# Patient Record
Sex: Male | Born: 1935 | Race: White | Hispanic: No | Smoking: Former smoker
Health system: Southern US, Community
[De-identification: ages and names within clinical notes are randomized; demographics above are authoritative.]

## PROBLEM LIST (undated history)

## (undated) DIAGNOSIS — R7302 Impaired glucose tolerance (oral): Secondary | ICD-10-CM

## (undated) DIAGNOSIS — K209 Esophagitis, unspecified without bleeding: Secondary | ICD-10-CM

## (undated) DIAGNOSIS — K5900 Constipation, unspecified: Secondary | ICD-10-CM

## (undated) DIAGNOSIS — Z974 Presence of external hearing-aid: Secondary | ICD-10-CM

## (undated) DIAGNOSIS — E119 Type 2 diabetes mellitus without complications: Secondary | ICD-10-CM

## (undated) DIAGNOSIS — H9319 Tinnitus, unspecified ear: Secondary | ICD-10-CM

## (undated) DIAGNOSIS — H9190 Unspecified hearing loss, unspecified ear: Secondary | ICD-10-CM

## (undated) DIAGNOSIS — R350 Frequency of micturition: Secondary | ICD-10-CM

## (undated) HISTORY — PX: OTHER SURGICAL HISTORY: SHX169

## (undated) HISTORY — DX: Tinnitus, unspecified ear: H93.19

## (undated) HISTORY — DX: Constipation, unspecified: K59.00

## (undated) HISTORY — DX: Esophagitis, unspecified without bleeding: K20.90

## (undated) HISTORY — DX: Frequency of micturition: R35.0

## (undated) HISTORY — DX: Esophagitis, unspecified: K20.9

## (undated) HISTORY — DX: Impaired glucose tolerance (oral): R73.02

---

## 2005-07-14 ENCOUNTER — Other Ambulatory Visit: Payer: Self-pay

## 2005-07-14 ENCOUNTER — Ambulatory Visit: Payer: Self-pay | Admitting: General Practice

## 2005-07-21 ENCOUNTER — Ambulatory Visit: Payer: Self-pay | Admitting: General Practice

## 2005-09-10 ENCOUNTER — Encounter: Payer: Self-pay | Admitting: General Practice

## 2005-09-16 ENCOUNTER — Encounter: Payer: Self-pay | Admitting: General Practice

## 2005-10-17 ENCOUNTER — Encounter: Payer: Self-pay | Admitting: General Practice

## 2006-02-10 ENCOUNTER — Ambulatory Visit: Payer: Self-pay | Admitting: Gastroenterology

## 2006-12-19 ENCOUNTER — Ambulatory Visit: Payer: Self-pay | Admitting: Gastroenterology

## 2007-09-27 ENCOUNTER — Ambulatory Visit: Payer: Self-pay | Admitting: Internal Medicine

## 2008-05-17 ENCOUNTER — Ambulatory Visit: Payer: Self-pay | Admitting: General Practice

## 2011-01-01 ENCOUNTER — Ambulatory Visit: Payer: Self-pay | Admitting: Surgery

## 2011-01-08 ENCOUNTER — Ambulatory Visit: Payer: Self-pay | Admitting: Surgery

## 2013-03-28 ENCOUNTER — Ambulatory Visit: Payer: Self-pay | Admitting: Otolaryngology

## 2014-05-15 DIAGNOSIS — K209 Esophagitis, unspecified without bleeding: Secondary | ICD-10-CM | POA: Insufficient documentation

## 2014-05-15 DIAGNOSIS — R7303 Prediabetes: Secondary | ICD-10-CM | POA: Insufficient documentation

## 2014-05-15 DIAGNOSIS — H9319 Tinnitus, unspecified ear: Secondary | ICD-10-CM | POA: Insufficient documentation

## 2014-05-15 DIAGNOSIS — K5900 Constipation, unspecified: Secondary | ICD-10-CM | POA: Insufficient documentation

## 2014-09-20 DIAGNOSIS — K644 Residual hemorrhoidal skin tags: Secondary | ICD-10-CM | POA: Insufficient documentation

## 2014-09-20 DIAGNOSIS — R739 Hyperglycemia, unspecified: Secondary | ICD-10-CM | POA: Insufficient documentation

## 2014-11-14 ENCOUNTER — Ambulatory Visit
Admit: 2014-11-14 | Disposition: A | Discharge status: Home / Self Care | Payer: Self-pay | Attending: Neurology | Admitting: Neurology

## 2014-12-18 ENCOUNTER — Encounter: Payer: Self-pay | Admitting: Urology

## 2014-12-18 ENCOUNTER — Other Ambulatory Visit: Payer: PPO

## 2014-12-23 ENCOUNTER — Telehealth: Payer: Self-pay

## 2014-12-23 ENCOUNTER — Other Ambulatory Visit: Payer: Self-pay

## 2014-12-23 DIAGNOSIS — N3289 Other specified disorders of bladder: Secondary | ICD-10-CM

## 2014-12-23 NOTE — Telephone Encounter (Signed)
Daughter called wanting lab results. Made aware of testosterone results. Daughter questioned insurance coverage. Made daughter aware front office would need to get back in contact with her in reference to insurance. Voiced understanding. Cw,lpn

## 2014-12-25 ENCOUNTER — Encounter: Payer: Self-pay | Admitting: *Deleted

## 2014-12-26 ENCOUNTER — Ambulatory Visit (INDEPENDENT_AMBULATORY_CARE_PROVIDER_SITE_OTHER): Payer: PPO | Admitting: Urology

## 2014-12-26 ENCOUNTER — Encounter: Payer: Self-pay | Admitting: Urology

## 2014-12-26 VITALS — BP 174/83 | HR 64 | Ht 70.0 in | Wt 166.3 lb

## 2014-12-26 DIAGNOSIS — K8020 Calculus of gallbladder without cholecystitis without obstruction: Secondary | ICD-10-CM | POA: Diagnosis not present

## 2014-12-26 DIAGNOSIS — N138 Other obstructive and reflux uropathy: Secondary | ICD-10-CM

## 2014-12-26 DIAGNOSIS — E291 Testicular hypofunction: Secondary | ICD-10-CM | POA: Diagnosis not present

## 2014-12-26 DIAGNOSIS — N401 Enlarged prostate with lower urinary tract symptoms: Secondary | ICD-10-CM

## 2014-12-26 MED ORDER — TESTOSTERONE 20.25 MG/1.25GM (1.62%) TD GEL: 20.0000 | Freq: Every day | TRANSDERMAL | Status: DC

## 2014-12-26 NOTE — Progress Notes (Signed)
12/26/2014 6:33 PM   CURLEY HOGEN May 14, 1934 [id number]  Referring provider: No referring provider defined for this encounter.  Chief Complaint: low testosterone level  HPI: Mr. Bordas is a 81 year old white male who presents today to discuss treatment options for his hypogonadism.  Patient was referred to [place] by his neurologist for urinary frequency. He is accompanied by his grandson at today's visit. His daughter was also on the phone, she could not be physically present for this visit. All 3 of them are very upset.  They feel like they've been getting the run around by his providers. The patient states he is just "shelling out money" and nobody will help him. During his visit with Dr. Edwyna Shell, he complained of fatigue. A serum testosterone level was drawn that afternoon and it returned low. He returned for a morning serum testosterone draw on 12/18/2014 and it was found to be 258 ng/dL. When  I questioned him further about his fatigue,  he stated over the last month had been experiencing progressive weakness in his lower legs. I stated that this sounded more like a neurological condition versus low testosterone. I asked him if he had any imaging done on his lower spine. He informed me that his neurologist ordered an MRI on 11/14/2014.  I was able to see the report on "chart review"  and there were findings of  mild left eccentric foraminal impingement at L5-S1 due to spurring along the solid intervertebral fusion. There is degenerative disc disease at other levels in the lumbar spine, but no other impingement is identified.  Suspected cholelithiasis.  I asked him if he had a follow-up appointment with his neurologist to the discussed these results and he stated he canceled that visit.  He did fill out an ADAM questionnaire and results are below.  He is complaining of other symptoms of hypogonadism, such as: a decrease in libido, lack of energy, a decrease in his strength and endurance, directions  being less strong, deterioration in his ability to play sports and a recent deterioration in his work.    The patient had told Dr. Edwyna Shell during their visit they found a spot on his bladder with the MRI and Dr. Edwyna Shell has ordered a CT urogram.  The patient denies any gross hematuria and no micro heme with his visit with Dr. Edwyna Shell.  It appears that the "spot on the bladder" was gallstones.   He was started on Vesicare 10 mg daily for his urinary frequency. He found no decrease in his urinary frequency with that medication, so he has discontinued the Vesicare.  He also had extreme dry mouth with the Vesicare.   Electronically Signed  By: Gaylyn Rong M.D.  On: 11/14/2014 12:13      Androgen Deficiency in the Aging Male      12/28/14 1700       Androgen Deficiency in the Aging Male   Do you have a decrease in libido (sex drive) Yes     Do you have lack of energy Yes     Do you have a decrease in strength and/or endurance Yes     Have you lost height No     Have you noticed a decreased "enjoyment of life" No     Are you sad and/or grumpy No     Are your erections less strong Yes     Have you noticed a recent deterioration in your ability to play sports Yes     Are you falling asleep  after dinner No     Has there been a recent deterioration in your work performance Yes          PMH: Past Medical History  Diagnosis Date  . Impaired glucose tolerance   . Esophagitis   . Tinnitus   . Constipation   . Urinary frequency   . Impaired glucose tolerance   . Esophagitis   . Tinnitus   . Constipation   . Urinary frequency     Surgical History: Past Surgical History  Procedure Laterality Date  . Palmer fascietomy and release of dupuytren's contracture      right little finger, left ring and little fingers    Home Medications:    Medication List       This list is accurate as of: 12/26/14 11:59 PM.  Always use your most recent med list.               acetaminophen  325 MG tablet  Commonly known as:  TYLENOL  Take 650 mg by mouth every 6 (six) hours as needed.     Cinnamon 500 MG capsule  Take 500 mg by mouth.     ibuprofen 100 MG tablet  Commonly known as:  ADVIL,MOTRIN  Take 100 mg by mouth every 6 (six) hours as needed for fever.     LORazepam 1 MG tablet  Commonly known as:  ATIVAN  take 1 pill 60 min before MRI and 2nd pill 15 min before MRI if needed. Patient should not drive when under influence of medicine.     omeprazole 20 MG capsule  Commonly known as:  PRILOSEC  Take 20 mg by mouth.     STIMULANT LAXATIVE 5 MG EC tablet  Generic drug:  bisacodyl  Take 5 mg by mouth.     Testosterone 20.25 MG/1.25GM (1.62%) Gel  Commonly known as:  ANDROGEL  Apply 20 each topically daily. Patient needs to apply 2 pumps daily     V-R VITAMIN B-12 500 MCG tablet  Generic drug:  cyanocobalamin  Take by mouth.        Allergies: No Known Allergies  Family History: Family History  Problem Relation Age of Onset  . Cancer    . Diabetes Mellitus II Mother   . Heart attack Father     Social History:  reports that he has quit smoking. He does not have any smokeless tobacco history on file. He reports that he does not drink alcohol. His drug history is not on file.  ROS: Urological Symptom Review  Patient is experiencing the following symptoms: Frequent urination Get up at night to urinate   Review of Systems  Gastrointestinal (upper)  : Indigestion/heartburn  Gastrointestinal (lower) : Constipation  Constitutional : Fatigue  Skin: Skin rash/lesion  Eyes: Negative for eye symptoms  Ear/Nose/Throat : Negative for Ear/Nose/Throat symptoms  Hematologic/Lymphatic: Easy bruising  Cardiovascular : Negative for cardiovascular symptoms  Respiratory : Negative for respiratory symptoms  Endocrine: Negative for endocrine symptoms  Musculoskeletal: Negative for musculoskeletal  symptoms  Neurological: Headaches  Psychologic: Negative for psychiatric symptoms   Physical Exam: BP 174/83 mmHg  Pulse 64  Ht 5\' 10"  (1.778 m)  Wt 166 lb 4.8 oz (75.433 kg)  BMI 23.86 kg/m2   Laboratory Data: No results found for: WBC, HGB, HCT, MCV, PLT  No results found for: CREATININE  No results found for: PSA  Lab Results  Component Value Date   TESTOSTERONE 258* 12/18/2014    No results found for:  HGBA1C  Urinalysis No results found for: COLORURINE, APPEARANCEUR, LABSPEC, PHURINE, GLUCOSEU, HGBUR, BILIRUBINUR, KETONESUR, PROTEINUR, UROBILINOGEN, NITRITE, LEUKOCYTESUR  Pertinent Imaging: CLINICAL DATA: Lower extremity weakness bilaterally  EXAM: MRI LUMBAR SPINE WITHOUT CONTRAST  TECHNIQUE: Multiplanar, multisequence MR imaging of the lumbar spine was performed. No intravenous contrast was administered.  COMPARISON: None.  FINDINGS: Dependent filling defects in a fluid-filled structure in the right upper quadrant suspicious for cholelithiasis.  The conus medullaris appears normal. Conus level: L1-2.  Solid interbody fusion at L5-S1. Disc desiccation is noted throughout the lumbar spine. Minimal degenerative endplate findings anteriorly at L1-2.  Additional findings at individual levels are as follows:  L1-2: No impingement. Mild disc bulge.  L2-3: No impingement. Incidental prominent bridging interbody spurring eccentric to the left.  L3-4: No impingement. Mild disc bulge.  L4-5: No impingement. Mild disc bulge.  L5-S1: Mild left and borderline right foraminal stenosis primarily due spurring along the intervertebral fusion.  IMPRESSION: 1. Mild left eccentric foraminal impingement at L5-S1 due to spurring along the solid intervertebral fusion. There is degenerative disc disease at other levels in the lumbar spine, but no other impingement is identified. 2. Suspected cholelithiasis.   Electronically Signed   By: Gaylyn Rong M.D.  On: 11/14/2014 12:13  Assessment & Plan:   1. Hypogonadism-  I explained to the patient that his lower leg weakness most likely contributed to by the findings on MRI. He needs to follow-up with his neurologist. He did have a follow-up appointment with his neurologist, but canceled that appointment. I encouraged him to reschedule.  I stated that even with testosterone therapy the lower leg weakness may still persist.  The patient and his family are not satisfied with this answer and since there is a low testosterone value and he has other symptoms of hypogonadism, I feel I need to treat the hypogonadism.  I discussed with the patient the side effects of testosterone therapy, such as: enlargement of the prostate gland that may in turn cause LUTS, possible increased risk of PCa, DVT's and/or PE's, possible increased risk of heart attack or stroke, lower sperm count, swelling of the ankles, feet, or body, with or without heart failure, enlarged or painful breasts, have problems breathing while you sleep (sleep apnea), increased prostate specific antigen, mood swings, hypertension and  increased red blood cell count.  After this discussion, He and his family are still wanting to proceed with the testosterone treatment.  I will prescribe AndroGel and advised him to apply 2 pumps daily and avoid to skin contact with women and males below the age of 72. He will return to the office in 1 month's time where we will repeat the ADAM questionnaire, an a.m. serum testosterone level (before 10 AM) and a symptom recheck.  I have also obtained a prolactin, LH and FSH at today's visit.  2. "Spot on bladder"-  by patient's report, he stated a spot was found on his bladder during the MRI. When reviewing the MRI report, the area of concern was located in the gallbladder and likely represents gallstones. Patient does not endorse gross hematuria nor did he have microscopic hematuria at his last visit.  We will cancel the CT urogram and the cystoscopy at this time. We will recheck an UA when he returns for an office visit in one month.  3. BPH with LUTS-  Patient was found to have an enlarged prostate on exam by Dr. Edwyna Shell. His PSA was 0.6 ng/mL on 12/13/2014. His symptoms did not respond to  the Vesicare.  He was scheduled for a cystoscopy because of the patient's report of a spot on his bladder. On further investigation, the spot was actually on the gallbladder and likely represented gallstones. The cystoscopy exam will be postponed. He will RTC in 1 month's time.  At that visit, we will obtain an IPSS and PVR.    There are no diagnoses linked to this encounter.  No Follow-up on file.  Michiel Cowboy, PA-C  [facility] [address], [address] [place], [place] [address] [phone]

## 2014-12-27 LAB — FSH/LH
FSH: 16 m[IU]/mL — ABNORMAL HIGH (ref 1.5–12.4)
LH: 21.2 m[IU]/mL — ABNORMAL HIGH (ref 1.7–8.6)

## 2014-12-27 LAB — PROLACTIN: Prolactin: 10.6 ng/mL (ref 4.0–15.2)

## 2014-12-27 LAB — TESTOSTERONE: TESTOSTERONE: 258 ng/dL — AB (ref 348–1197)

## 2014-12-28 ENCOUNTER — Telehealth: Payer: Self-pay | Admitting: Urology

## 2014-12-28 DIAGNOSIS — N138 Other obstructive and reflux uropathy: Secondary | ICD-10-CM | POA: Insufficient documentation

## 2014-12-28 DIAGNOSIS — E291 Testicular hypofunction: Secondary | ICD-10-CM | POA: Insufficient documentation

## 2014-12-28 DIAGNOSIS — N401 Enlarged prostate with lower urinary tract symptoms: Secondary | ICD-10-CM

## 2014-12-28 DIAGNOSIS — K8020 Calculus of gallbladder without cholecystitis without obstruction: Secondary | ICD-10-CM | POA: Insufficient documentation

## 2014-12-28 NOTE — Telephone Encounter (Signed)
We also need to cancel his cystoscopy and have him follow up with me in one month.

## 2014-12-28 NOTE — Telephone Encounter (Signed)
We need to cancel his CT Urogram.  Patient does not have hematuria and the "spot" on the bladder was in the gallbladder, not the urinary bladder.

## 2014-12-30 ENCOUNTER — Telehealth: Payer: Self-pay

## 2014-12-30 NOTE — Telephone Encounter (Signed)
-----   Message from Harle Battiest, PA-C sent at 12/29/2014  7:29 PM EDT ----- Patient's LH and FSH are elevated. He needs a referral to an endocrinologist.

## 2014-12-30 NOTE — Telephone Encounter (Signed)
Pt called back about the missed call this morning. Troy Preston would like to make it clear that [name] is to receive all phone calls. She is in charge of everything for Troy Preston. Troy Preston best contact # [phone].  12/30/14 MAF

## 2014-12-30 NOTE — Telephone Encounter (Signed)
Pt has been made aware of elevated labs. Can you please schedule referral. Cw,lpn

## 2014-12-31 ENCOUNTER — Telehealth: Payer: Self-pay

## 2014-12-31 NOTE — Telephone Encounter (Signed)
Spoke with Durene Fruits, pt daughter, in reference to Androgel PA. Androgel was approved until 07/19/15. Cw,lpn

## 2015-01-15 ENCOUNTER — Ambulatory Visit: Payer: Self-pay

## 2015-01-24 ENCOUNTER — Other Ambulatory Visit: Payer: PPO

## 2015-01-24 DIAGNOSIS — E291 Testicular hypofunction: Secondary | ICD-10-CM

## 2015-01-25 LAB — TESTOSTERONE: Testosterone: 332 ng/dL — ABNORMAL LOW (ref 348–1197)

## 2015-01-25 LAB — FSH/LH
FSH: 9.7 m[IU]/mL (ref 1.5–12.4)
LH: 10.6 m[IU]/mL — AB (ref 1.7–8.6)

## 2015-01-27 ENCOUNTER — Telehealth: Payer: Self-pay

## 2015-01-27 NOTE — Telephone Encounter (Signed)
-----   Message from Vanna ScotlandAshley Brandon, MD sent at 01/27/2015  2:54 PM EDT ----- Please let patient's daughter know results.  I don't know what Carollee HerterShannon will want to do with this information but it seems that the LH/FSH have normalized somewhat and that his Isidor Holtsesterone is very close to normal.  Further recs can be make by Carollee HerterShannon when she returns next week.  Vanna ScotlandAshley Brandon, MD

## 2015-01-29 ENCOUNTER — Other Ambulatory Visit: Payer: Self-pay

## 2015-01-29 ENCOUNTER — Encounter: Payer: Self-pay | Admitting: *Deleted

## 2015-01-29 ENCOUNTER — Emergency Department
Admission: EM | Admit: 2015-01-29 | Discharge: 2015-01-29 | Disposition: A | Discharge status: Home / Self Care | Payer: PPO | Attending: Emergency Medicine | Admitting: Emergency Medicine

## 2015-01-29 DIAGNOSIS — R61 Generalized hyperhidrosis: Secondary | ICD-10-CM | POA: Insufficient documentation

## 2015-01-29 DIAGNOSIS — R112 Nausea with vomiting, unspecified: Secondary | ICD-10-CM | POA: Insufficient documentation

## 2015-01-29 DIAGNOSIS — R55 Syncope and collapse: Secondary | ICD-10-CM

## 2015-01-29 DIAGNOSIS — Z87891 Personal history of nicotine dependence: Secondary | ICD-10-CM | POA: Diagnosis not present

## 2015-01-29 DIAGNOSIS — Z79899 Other long term (current) drug therapy: Secondary | ICD-10-CM | POA: Insufficient documentation

## 2015-01-29 DIAGNOSIS — R42 Dizziness and giddiness: Secondary | ICD-10-CM | POA: Diagnosis present

## 2015-01-29 LAB — CBC WITH DIFFERENTIAL/PLATELET
BASOS ABS: 0 10*3/uL (ref 0–0.1)
Basophils Relative: 1 %
EOS ABS: 0.1 10*3/uL (ref 0–0.7)
Eosinophils Relative: 2 %
HCT: 40.3 % (ref 40.0–52.0)
Hemoglobin: 13.8 g/dL (ref 13.0–18.0)
LYMPHS PCT: 24 %
Lymphs Abs: 1.4 10*3/uL (ref 1.0–3.6)
MCH: 31.9 pg (ref 26.0–34.0)
MCHC: 34.3 g/dL (ref 32.0–36.0)
MCV: 93 fL (ref 80.0–100.0)
MONOS PCT: 8 %
Monocytes Absolute: 0.4 10*3/uL (ref 0.2–1.0)
Neutro Abs: 3.7 10*3/uL (ref 1.4–6.5)
Neutrophils Relative %: 65 %
Platelets: 144 10*3/uL — ABNORMAL LOW (ref 150–440)
RBC: 4.33 MIL/uL — ABNORMAL LOW (ref 4.40–5.90)
RDW: 13.2 % (ref 11.5–14.5)
WBC: 5.7 10*3/uL (ref 3.8–10.6)

## 2015-01-29 LAB — COMPREHENSIVE METABOLIC PANEL
ALT: 17 U/L (ref 17–63)
AST: 23 U/L (ref 15–41)
Albumin: 3.8 g/dL (ref 3.5–5.0)
Alkaline Phosphatase: 50 U/L (ref 38–126)
Anion gap: 8 (ref 5–15)
BUN: 13 mg/dL (ref 6–20)
CO2: 25 mmol/L (ref 22–32)
Calcium: 8.9 mg/dL (ref 8.9–10.3)
Chloride: 109 mmol/L (ref 101–111)
Creatinine, Ser: 1 mg/dL (ref 0.61–1.24)
Glucose, Bld: 141 mg/dL — ABNORMAL HIGH (ref 65–99)
Potassium: 3.7 mmol/L (ref 3.5–5.1)
Sodium: 142 mmol/L (ref 135–145)
Total Bilirubin: 0.5 mg/dL (ref 0.3–1.2)
Total Protein: 7 g/dL (ref 6.5–8.1)

## 2015-01-29 LAB — TROPONIN I
Troponin I: <0.03 ng/mL (ref <0.031)
Troponin I: 0.03 ng/mL (ref <0.031)

## 2015-01-29 MED ORDER — SODIUM CHLORIDE 0.9 % IV BOLUS (SEPSIS)
1000.0000 mL | Freq: Once | INTRAVENOUS | Status: AC
  Administered 2015-01-29: 1000 mL via INTRAVENOUS

## 2015-01-29 NOTE — Discharge Instructions (Signed)
Holter Monitoring A Holter monitor is a small device with electrodes (small sticky patches) that attach to your chest. It records the electrical activity of your heart and is worn continuously for 24-48 hours.  A HOLTER MONITOR IS USED TO  Detect heart problems such as:  Heart arrhythmia. Is an abnormal or irregular heartbeat. With some heart arrhythmias, you may not feel or know that you have an irregular heart rhythm.  Palpitations, such as feeling your heart racing or fluttering. It is possible to have heart palpitations and not have a heart arrhythmia.  A heart rhythm that is too slow or too fast.  If you have problems fainting, near fainting or feeling light-headed, a Holter monitor may be worn to see if your heart is the cause. HOLTER MONITOR PREPARATION   Electrodes will be attached to the skin on your chest.  If you have hair on your chest, small areas may have to be shaved. This is done to help the patches stick better and make the recording more accurate.  The electrodes are attached by wires to the Holter monitor. The Holter monitor clips to your clothing. You will wear the monitor at all times, even while exercising and sleeping. HOME CARE INSTRUCTIONS   Wear your monitor at all times.  The wires and the monitor must stay dry. Do not get the monitor wet.  Do not bathe, swim or use a hot tub with it on.  You may do a "sponge" bath while you have the monitor on.  Keep your skin clean, do not put body lotion or moisturizer on your chest.  It's possible that your skin under the electrodes could become irritated. To keep this from happening, you may put the electrodes in slightly different places on your chest.  Your caregiver will also ask you to keep a diary of your activities, such as walking or doing chores. Be sure to note what you are doing if you experience heart symptoms such as palpitations. This will help your caregiver determine what might be contributing to your  symptoms. The information stored in your monitor will be reviewed by your caregiver alongside your diary entries.  Make sure the monitor is safely clipped to your clothing or in a location close to your body that your caregiver recommends.  The monitor and electrodes are removed when the test is over. Return the monitor as directed.  Be sure to follow up with your caregiver and discuss your Holter monitor results. SEEK IMMEDIATE MEDICAL CARE IF:  You faint or feel lightheaded.  You have trouble breathing.  You get pain in your chest, upper arm or jaw.  You feel sick to your stomach and your skin is pale, cool, or damp.  You think something is wrong with the way your heart is beating. MAKE SURE YOU:   Understand these instructions.  Will watch your condition.  Will get help right away if you are not doing well or get worse. Document Released: 04/02/2004 Document Revised: 09/27/2011 Document Reviewed: 08/15/2008 [facility] Patient Information 2015 [place], [place]. This information is not intended to replace advice given to you by your health care provider. Make sure you discuss any questions you have with your health care provider.  Near-Syncope Near-syncope (commonly known as near fainting) is sudden weakness, dizziness, or feeling like you might pass out. This can happen when getting up or while standing for a long time. It is caused by a sudden decrease in blood flow to the brain, which can occur  for various reasons. Most of the reasons are not serious.  HOME CARE Watch your condition for any changes.  Have someone stay with you until you feel stable.  If you feel like you are going to pass out:  Lie down right away.  Prop your feet up if you can.  Breathe deeply and steadily.  Move only when the feeling has gone away. Most of the time, this feeling lasts only a few minutes. You may feel tired for several hours.  Drink enough fluids to keep your pee (urine) clear or pale  yellow.  If you are taking blood pressure or heart medicine, stand up slowly.  Follow up with your doctor as told. GET HELP RIGHT AWAY IF:   You have a severe headache.  You have unusual pain in the chest, belly (abdomen), or back.  You have bleeding from the mouth or butt (rectum), or you have black or tarry poop (stool).  You feel your heart beat differently than normal, or you have a very fast pulse.  You pass out, or you twitch and shake when you pass out.  You pass out when sitting or lying down.  You feel confused.  You have trouble walking.  You are weak.  You have vision problems. MAKE SURE YOU:   Understand these instructions.  Will watch your condition.  Will get help right away if you are not doing well or get worse. Document Released: 12/22/2007 Document Revised: 07/10/2013 Document Reviewed: 12/08/2012 [facility] Patient Information 2015 [place], [place]. This information is not intended to replace advice given to you by your health care provider. Make sure you discuss any questions you have with your health care provider.      You have been seen in the emergency department for generalized weakness, and nausea. Your workup today show normal results. However it is very important that you follow up with cardiology tomorrow. Please call the number provided 8:30 in the morning to arrange a follow-up appointment within the next 2 days. Return to the emergency department for any further episodes of weakness, nausea, or any chest pain or trouble breathing.

## 2015-01-29 NOTE — ED Notes (Signed)

## 2015-01-29 NOTE — ED Notes (Signed)
Family at bedside.  Iv fluids infusing.  Skin warm and dry.  No pain.  Speech clear.

## 2015-01-29 NOTE — ED Notes (Signed)
Pt brought in via ems from home with episode of dizziness with n/v.  No chest pain.  No sob.  No abd pain.  Iv in place.  zofran given by ems with good results.  md at bedside on arrival.

## 2015-01-29 NOTE — ED Notes (Signed)
Assisted patient up to bathroom and back to bed.  

## 2015-01-29 NOTE — ED Provider Notes (Signed)
[facility] Emergency Department Provider Note  Time seen: 4:46 PM  I have reviewed the triage vital signs and the nursing notes.   HISTORY  Chief Complaint Weakness   HPI Troy Preston is a 81 y.o. male with no past medical history who presents to the emergency department with acute onset of nausea, diaphoresis, vomiting. According to the patient he was outside talking to his neighbor approximately 45 minutes ago when he became acutely nauseated, said he felt very lightheaded, weak, with vomiting. Denies any chest pain at any time. EMS was called. EMS states upon arrival patient is very diaphoretic and very pale. They gave the patient Zofran, and he felt better.Patient denies any history of heart disease, or stents. Denies any leg pain or swelling. Denies any shortness of breath at any time. Denies any abdominal pain. States he feels much better at this time. However at the time he would describe the symptoms as severe.     Past Medical History  Diagnosis Date  . Impaired glucose tolerance   . Esophagitis   . Tinnitus   . Constipation   . Urinary frequency   . Impaired glucose tolerance   . Esophagitis   . Tinnitus   . Constipation   . Urinary frequency     Patient Active Problem List   Diagnosis Date Noted  . Hypogonadism in male 12/28/2014  . 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 Gallstone 12/28/2014  . Blood glucose elevated 09/20/2014  . External hemorrhoid 09/20/2014  . CN (constipation) 05/15/2014  . Esophagitis 05/15/2014  . Borderline diabetes 05/15/2014  . Buzzing in ear 05/15/2014    Past Surgical History  Procedure Laterality Date  . Palmer fascietomy and release of dupuytren's contracture      right little finger, left ring and little fingers    Current Outpatient Rx  Name  Route  Sig  Dispense  Refill  . acetaminophen (TYLENOL) 325 MG tablet   Oral   Take 650 mg by mouth every 6 (six) hours as  needed.         . bisacodyl (STIMULANT LAXATIVE) 5 MG EC tablet   Oral   Take 5 mg by mouth.         . Cinnamon 500 MG capsule   Oral   Take 500 mg by mouth.         . cyanocobalamin (V-R VITAMIN B-12) 500 MCG tablet   Oral   Take by mouth.         Marland Kitchen. ibuprofen (ADVIL,MOTRIN) 100 MG tablet   Oral   Take 100 mg by mouth every 6 (six) hours as needed for fever.         Marland Kitchen. LORazepam (ATIVAN) 1 MG tablet      take 1 pill 60 min before MRI and 2nd pill 15 min before MRI if needed. Patient should not drive when under influence of medicine.         Marland Kitchen. omeprazole (PRILOSEC) 20 MG capsule   Oral   Take 20 mg by mouth.         . Testosterone (ANDROGEL) 20.25 MG/1.25GM (1.62%) GEL   Topical   Apply 20 each topically daily. Patient needs to apply 2 pumps daily   150 g   5     Allergies Review of patient's allergies indicates no known allergies.  Family History  Problem Relation Age of Onset  . Cancer    . Diabetes Mellitus II Mother   .  Heart attack Father     Social History History  Substance Use Topics  . Smoking status: Former Games developer  . Smokeless tobacco: Not on file  . Alcohol Use: No    Review of Systems Constitutional: Negative for fever. Cardiovascular: Negative for chest pain. Respiratory: Negative for shortness of breath. Gastrointestinal: Negative for abdominal pain. Positive for nausea and vomiting. Musculoskeletal: Negative for back pain. Skin: Positive for diaphoresis. Neurological: Negative for headaches, focal weakness or numbness. 10-point ROS otherwise negative.  ____________________________________________   PHYSICAL EXAM:  VITAL SIGNS: ED Triage Vitals  Enc Vitals Group     BP --      Pulse --      Resp --      Temp --      Temp src --      SpO2 --      Weight --      Height --      Head Cir --      Peak Flow --      Pain Score --      Pain Loc --      Pain Edu? --      Excl. in GC? --     Constitutional: Alert  and oriented. Well appearing and in no distress. Eyes: Normal exam ENT   Mouth/Throat: Mucous membranes are moist. Cardiovascular: Normal rate, regular rhythm. No murmur Respiratory: Normal respiratory effort without tachypnea nor retractions. Breath sounds are clear and equal bilaterally. No wheezes/rales/rhonchi. Gastrointestinal: Soft and nontender. No distention.   Musculoskeletal: Nontender with normal range of motion in all extremities. No lower extremity tenderness or edema. Neurologic:  Normal speech and language. No gross focal neurologic deficits Skin:  Skin is warm, dry and intact.  Psychiatric: Mood and affect are normal. Speech and behavior are normal.   ____________________________________________    EKG  EKG reviewed and interpreted by myself shows sinus bradycardia at 56 bpm, widened QRS, RSR pattern consistent with right bundle branch block, with a left anterior fascicular block, nonspecific ST changes present. No ST elevations noted. Overall abnormal, but not an acutely concerning EKG.  ____________________________________________   INITIAL IMPRESSION / ASSESSMENT AND PLAN / ED COURSE  Pertinent labs & imaging results that were available during my care of the patient were reviewed by me and considered in my medical decision making (see chart for details).  Patient with acute onset of nausea, diaphoresis and generalized weakness. We will check labs including troponin. Patient denies any symptoms at this time. We'll continue IV hydration and monitor closely in the emergency department on telemetry.  Labs within normal limits. Repeat troponin within normal limits. Patient denies any symptoms since his original symptoms. Discussed with the patient and he wishes to go home. Patient will call cardiology tomorrow to arrange a follow-up appointment for a likely Holter monitor. Discussed very strict return precautions with the patient for any further episodes, the patient is  agreeable. We will discharge the patient home at this time.  ____________________________________________   FINAL CLINICAL IMPRESSION(S) / ED DIAGNOSES  Nausea Generalized weakness Near-syncope   Minna Antis, MD 01/29/15 2121

## 2015-01-29 NOTE — ED Notes (Signed)
Pt reports episode of dizziness while talking on the phone today.  Pt had diaphoresis, nausea and vomiting.  N/v relieved with iv  Zofran given by ems.  No chest pain or sob.  No h/a.  Alert  Speech clear.  md with pt on arrival.  Skin warm and dry.  Pt states he is feeling better.  No pain

## 2015-01-29 NOTE — ED Notes (Signed)
md in with pt and family.   

## 2015-02-05 NOTE — Telephone Encounter (Signed)
His FSH and LH have decreased, but I still want him to be evaluated by endocrinology.

## 2015-02-05 NOTE — Telephone Encounter (Signed)
Pt daughter called wanting to know if you have had a change to look at pt labs-LF/FSH.

## 2015-02-06 NOTE — Telephone Encounter (Signed)
LMOM

## 2015-02-07 ENCOUNTER — Other Ambulatory Visit: Payer: Self-pay | Admitting: Internal Medicine

## 2015-02-07 DIAGNOSIS — R531 Weakness: Secondary | ICD-10-CM

## 2015-02-07 DIAGNOSIS — R2681 Unsteadiness on feet: Secondary | ICD-10-CM

## 2015-02-07 NOTE — Telephone Encounter (Signed)
Spoke with [place], pt daughter, in reference to lab values. Neysa Bonito stated pt just left Dr. Tor Netters) office and are headed to have a CT scan. Per Neysa Bonito Dr. Marcello Fennel does not want pt to go see an endocrinologist. Neysa Bonito also stated Dr. Marcello Fennel stated he could monitor the [facility] and FSH levels and treat him as needed. When questioned about CT scan Christy stated Dr. Marcello Fennel thinks pt may have had a TIA and wants CT scan.

## 2015-02-13 ENCOUNTER — Ambulatory Visit
Admission: RE | Admit: 2015-02-13 | Discharge: 2015-02-13 | Disposition: A | Discharge status: Home / Self Care | Payer: PPO | Source: Ambulatory Visit | Attending: Internal Medicine | Admitting: Internal Medicine

## 2015-02-13 DIAGNOSIS — R531 Weakness: Secondary | ICD-10-CM

## 2015-02-13 DIAGNOSIS — R2681 Unsteadiness on feet: Secondary | ICD-10-CM | POA: Diagnosis not present

## 2015-02-13 MED ORDER — GADOBENATE DIMEGLUMINE 529 MG/ML IV SOLN
15.0000 mL | Freq: Once | INTRAVENOUS | Status: AC | PRN
  Administered 2015-02-13: 15 mL via INTRAVENOUS

## 2017-11-24 DIAGNOSIS — Z125 Encounter for screening for malignant neoplasm of prostate: Secondary | ICD-10-CM | POA: Diagnosis not present

## 2017-11-24 DIAGNOSIS — R739 Hyperglycemia, unspecified: Secondary | ICD-10-CM | POA: Diagnosis not present

## 2017-11-24 DIAGNOSIS — K219 Gastro-esophageal reflux disease without esophagitis: Secondary | ICD-10-CM | POA: Diagnosis not present

## 2017-11-24 DIAGNOSIS — K5909 Other constipation: Secondary | ICD-10-CM | POA: Diagnosis not present

## 2017-11-24 DIAGNOSIS — Z0000 Encounter for general adult medical examination without abnormal findings: Secondary | ICD-10-CM | POA: Diagnosis not present

## 2017-11-24 DIAGNOSIS — Z720 Tobacco use: Secondary | ICD-10-CM | POA: Diagnosis not present

## 2017-12-01 DIAGNOSIS — R739 Hyperglycemia, unspecified: Secondary | ICD-10-CM | POA: Diagnosis not present

## 2017-12-01 DIAGNOSIS — Z720 Tobacco use: Secondary | ICD-10-CM | POA: Diagnosis not present

## 2017-12-01 DIAGNOSIS — Z0000 Encounter for general adult medical examination without abnormal findings: Secondary | ICD-10-CM | POA: Diagnosis not present

## 2017-12-01 DIAGNOSIS — K5900 Constipation, unspecified: Secondary | ICD-10-CM | POA: Diagnosis not present

## 2017-12-01 DIAGNOSIS — K219 Gastro-esophageal reflux disease without esophagitis: Secondary | ICD-10-CM | POA: Diagnosis not present

## 2018-06-07 DIAGNOSIS — K5900 Constipation, unspecified: Secondary | ICD-10-CM | POA: Diagnosis not present

## 2018-06-08 ENCOUNTER — Emergency Department
Admission: EM | Admit: 2018-06-08 | Discharge: 2018-06-08 | Disposition: A | Discharge status: Home / Self Care | Payer: Medicare HMO | Attending: Emergency Medicine | Admitting: Emergency Medicine

## 2018-06-08 ENCOUNTER — Encounter: Payer: Self-pay | Admitting: Emergency Medicine

## 2018-06-08 ENCOUNTER — Emergency Department: Payer: Medicare HMO

## 2018-06-08 DIAGNOSIS — K625 Hemorrhage of anus and rectum: Secondary | ICD-10-CM | POA: Diagnosis not present

## 2018-06-08 DIAGNOSIS — Z79899 Other long term (current) drug therapy: Secondary | ICD-10-CM | POA: Insufficient documentation

## 2018-06-08 DIAGNOSIS — Z87891 Personal history of nicotine dependence: Secondary | ICD-10-CM | POA: Diagnosis not present

## 2018-06-08 DIAGNOSIS — K5900 Constipation, unspecified: Secondary | ICD-10-CM | POA: Diagnosis not present

## 2018-06-08 DIAGNOSIS — R195 Other fecal abnormalities: Secondary | ICD-10-CM | POA: Diagnosis not present

## 2018-06-08 MED ORDER — MAGNESIUM CITRATE PO SOLN
1.0000 | Freq: Once | ORAL | Status: AC
  Administered 2018-06-08: 1 via ORAL
  Filled 2018-06-08: qty 296

## 2018-06-08 MED ORDER — POLYETHYLENE GLYCOL 3350 17 G PO PACK
17.0000 g | PACK | Freq: Every day | ORAL | Status: DC
  Administered 2018-06-08: 17 g via ORAL
  Filled 2018-06-08: qty 1

## 2018-06-08 NOTE — ED Notes (Signed)
Pt reporting a small BM after attempting but denies feeling relief.

## 2018-06-08 NOTE — Discharge Instructions (Signed)
You should take the daily Miralax for regular soft stools. Increase your intake of fiber-rich foods. Consider prunes, fresh fruit, salads and greens. Follow-up with your provider or return as needed.

## 2018-06-08 NOTE — ED Triage Notes (Signed)
Pt reports his bowels are blocked. Pt reports has been using medications to help him intermittently. Pt reports this am he used a suppository this morning and he still can not have a BM. Pt reports no BM for 3 days. Pt reports difficulty moving his bowels lately and now he thinks he is blocked.

## 2018-06-08 NOTE — ED Provider Notes (Signed)
[facility] Emergency Department Provider Note ____________________________________________  Time seen: 1213  I have reviewed the triage vital signs and the nursing notes.  HISTORY  Chief Complaint  Constipation  HPI Troy Preston is a 84 y.o. male who presents himself to the ED for evaluation of a 2 to 3-day complaint of constipation.  Patient reports a history of slow stools and constipation in the past.  He normally uses intermittent stool softeners, and will occasionally insert a rectal suppository.  He describes using a suppository this morning, and has not reported a bowel movement until he returned from x-ray.  He reports moderate BM just prior to this evaluation.  He denies any fevers, chills, or sweats.  He did note some mild bright red blood from the rectum when he stooled.  He denies any other complaints at this time.  Past Medical History:  Diagnosis Date  . Constipation   . Constipation   . Esophagitis   . Esophagitis   . Impaired glucose tolerance   . Impaired glucose tolerance   . Tinnitus   . Tinnitus   . Urinary frequency   . Urinary frequency     Patient Active Problem List   Diagnosis Date Noted  . Hypogonadism in male 12/28/2014  . 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 Gallstone 12/28/2014  . Blood glucose elevated 09/20/2014  . External hemorrhoid 09/20/2014  . CN (constipation) 05/15/2014  . Esophagitis 05/15/2014  . Borderline diabetes 05/15/2014  . Buzzing in ear 05/15/2014    Past Surgical History:  Procedure Laterality Date  . Palmer fascietomy and release of Dupuytren's contracture     right little finger, left ring and little fingers    Prior to Admission medications   Medication Sig Start Date End Date Taking? Authorizing Provider  acetaminophen (TYLENOL) 325 MG tablet Take 650 mg by mouth every 6 (six) hours as needed.    [provider]  bisacodyl (STIMULANT LAXATIVE) 5 MG EC  tablet Take 5 mg by mouth.    [provider]  Cinnamon 500 MG capsule Take 500 mg by mouth.    [provider]  cyanocobalamin (V-R VITAMIN B-12) 500 MCG tablet Take by mouth.    [provider]  ibuprofen (ADVIL,MOTRIN) 100 MG tablet Take 100 mg by mouth every 6 (six) hours as needed for fever.    [provider]  LORazepam (ATIVAN) 1 MG tablet take 1 pill 60 min before MRI and 2nd pill 15 min before MRI if needed. Patient should not drive when under influence of medicine. 11/06/14   [provider]  omeprazole (PRILOSEC) 20 MG capsule Take 20 mg by mouth.    [provider]  Testosterone (ANDROGEL) 20.25 MG/1.25GM (1.62%) GEL Apply 20 each topically daily. Patient needs to apply 2 pumps daily 12/26/14   Michiel Cowboy A, PA-C    Allergies Patient has no known allergies.  Family History  Problem Relation Age of Onset  . Cancer Unknown   . Diabetes Mellitus II Mother   . Heart attack Father     Social History Social History   Tobacco Use  . Smoking status: Former Smoker  Substance Use Topics  . Alcohol use: No    Alcohol/week: 0.0 standard drinks  . Drug use: Not on file    Review of Systems  Constitutional: Negative for fever. Cardiovascular: Negative for chest pain. Respiratory: Negative for shortness of breath. Gastrointestinal: Negative for abdominal pain, vomiting and diarrhea.  Reports  constipation as above. Genitourinary: Negative for dysuria. Musculoskeletal: Negative for back pain. Skin: Negative for rash. Neurological: Negative for headaches, focal weakness or numbness. ____________________________________________  PHYSICAL EXAM:  VITAL SIGNS: ED Triage Vitals [06/08/18 1014]  Enc Vitals Group     BP 124/83     Pulse Rate 87     Resp 20     Temp (!) 97.4 F (36.3 C)     Temp Source Oral     SpO2 100 %     Weight 170 lb (77.1 kg)     Height 5\' 6"  (1.676 m)     Head Circumference      Peak Flow       Pain Score 0     Pain Loc      Pain Edu?      Excl. in GC?     Constitutional: Alert and oriented. Well appearing and in no distress. Head: Normocephalic and atraumatic. Eyes: Conjunctivae are normal. Normal extraocular movements Cardiovascular: Normal rate, regular rhythm. Normal distal pulses. Respiratory: Normal respiratory effort. No wheezes/rales/rhonchi. Gastrointestinal: Soft and nontender. No distention.  Normal active bowel sounds. Musculoskeletal: Nontender with normal range of motion in all extremities.  Neurologic:  Normal gait without ataxia. Normal speech and language. No gross focal neurologic deficits are appreciated. ____________________________________________   RADIOLOGY  ABD 2V  IMPRESSION: Large stool burden.  No evidence for acute  abnormality. ____________________________________________  PROCEDURES  Procedures Magnesium citrate 1 bottle MiraLAX 17 g p.o. ____________________________________________  INITIAL IMPRESSION / ASSESSMENT AND PLAN / ED COURSE  Geriatric patient with ED evaluation of constipation for 2 to 3 days.  Patient with a history of intermittent constipation presents with reported symptoms.  His exam is overall benign at this time.  Patient admits to a large bowel movement prior to discharge.  Patient will be discharged with instructions to take daily MiraLAX for stool softening.  He is also encouraged to increase his daily dietary fiber.  He will follow-up with primary provider or return to the ED as needed. ____________________________________________  FINAL CLINICAL IMPRESSION(S) / ED DIAGNOSES  Final diagnoses:  Constipation      Karmen StabsMenshew, Charlesetta IvoryJenise V Bacon, PA-C 06/08/18 1221    Emily FilbertWilliams, Jonathan E, MD 06/08/18 1409

## 2018-06-08 NOTE — ED Notes (Signed)
Pt ambulatory to the bathroom at this time after verbalizing he felt as though he needed to have a BM. Pt has stool like liquid leaking from his rectum while walking and reports lhe has had that and small BMs for three days but does not feel like they have been substantial BMs. NO abd pain upon assessment. No tenderness.

## 2018-06-19 DIAGNOSIS — R739 Hyperglycemia, unspecified: Secondary | ICD-10-CM | POA: Diagnosis not present

## 2018-06-19 DIAGNOSIS — Z13220 Encounter for screening for lipoid disorders: Secondary | ICD-10-CM | POA: Diagnosis not present

## 2018-06-19 DIAGNOSIS — Z720 Tobacco use: Secondary | ICD-10-CM | POA: Diagnosis not present

## 2018-06-19 DIAGNOSIS — K219 Gastro-esophageal reflux disease without esophagitis: Secondary | ICD-10-CM | POA: Diagnosis not present

## 2018-06-19 DIAGNOSIS — K5900 Constipation, unspecified: Secondary | ICD-10-CM | POA: Diagnosis not present

## 2018-06-19 DIAGNOSIS — I10 Essential (primary) hypertension: Secondary | ICD-10-CM | POA: Diagnosis not present

## 2018-06-19 DIAGNOSIS — Z0000 Encounter for general adult medical examination without abnormal findings: Secondary | ICD-10-CM | POA: Diagnosis not present

## 2018-06-26 DIAGNOSIS — K5900 Constipation, unspecified: Secondary | ICD-10-CM | POA: Diagnosis not present

## 2018-06-26 DIAGNOSIS — E1165 Type 2 diabetes mellitus with hyperglycemia: Secondary | ICD-10-CM | POA: Diagnosis not present

## 2018-06-26 DIAGNOSIS — H9313 Tinnitus, bilateral: Secondary | ICD-10-CM | POA: Diagnosis not present

## 2018-06-26 DIAGNOSIS — Z0000 Encounter for general adult medical examination without abnormal findings: Secondary | ICD-10-CM | POA: Diagnosis not present

## 2018-12-22 DIAGNOSIS — E1165 Type 2 diabetes mellitus with hyperglycemia: Secondary | ICD-10-CM | POA: Diagnosis not present

## 2018-12-22 DIAGNOSIS — K5900 Constipation, unspecified: Secondary | ICD-10-CM | POA: Diagnosis not present

## 2018-12-22 DIAGNOSIS — H9313 Tinnitus, bilateral: Secondary | ICD-10-CM | POA: Diagnosis not present

## 2018-12-22 DIAGNOSIS — Z0000 Encounter for general adult medical examination without abnormal findings: Secondary | ICD-10-CM | POA: Diagnosis not present

## 2018-12-26 DIAGNOSIS — K644 Residual hemorrhoidal skin tags: Secondary | ICD-10-CM | POA: Diagnosis not present

## 2018-12-26 DIAGNOSIS — R739 Hyperglycemia, unspecified: Secondary | ICD-10-CM | POA: Diagnosis not present

## 2018-12-26 DIAGNOSIS — D649 Anemia, unspecified: Secondary | ICD-10-CM | POA: Diagnosis not present

## 2018-12-26 DIAGNOSIS — E1165 Type 2 diabetes mellitus with hyperglycemia: Secondary | ICD-10-CM | POA: Diagnosis not present

## 2018-12-26 DIAGNOSIS — Z79899 Other long term (current) drug therapy: Secondary | ICD-10-CM | POA: Diagnosis not present

## 2018-12-26 DIAGNOSIS — H9313 Tinnitus, bilateral: Secondary | ICD-10-CM | POA: Diagnosis not present

## 2018-12-26 DIAGNOSIS — K5900 Constipation, unspecified: Secondary | ICD-10-CM | POA: Diagnosis not present

## 2018-12-26 DIAGNOSIS — Z0000 Encounter for general adult medical examination without abnormal findings: Secondary | ICD-10-CM | POA: Diagnosis not present

## 2019-01-04 DIAGNOSIS — D649 Anemia, unspecified: Secondary | ICD-10-CM | POA: Diagnosis not present

## 2019-03-21 DIAGNOSIS — E119 Type 2 diabetes mellitus without complications: Secondary | ICD-10-CM | POA: Diagnosis not present

## 2019-03-21 DIAGNOSIS — D649 Anemia, unspecified: Secondary | ICD-10-CM | POA: Diagnosis not present

## 2019-03-21 DIAGNOSIS — K2100 Gastro-esophageal reflux disease with esophagitis, without bleeding: Secondary | ICD-10-CM | POA: Diagnosis not present

## 2019-03-21 DIAGNOSIS — Z87891 Personal history of nicotine dependence: Secondary | ICD-10-CM | POA: Diagnosis not present

## 2019-03-21 DIAGNOSIS — E291 Testicular hypofunction: Secondary | ICD-10-CM | POA: Diagnosis not present

## 2019-03-21 DIAGNOSIS — K5900 Constipation, unspecified: Secondary | ICD-10-CM | POA: Diagnosis not present

## 2019-03-27 DIAGNOSIS — Z87891 Personal history of nicotine dependence: Secondary | ICD-10-CM | POA: Diagnosis not present

## 2019-03-27 DIAGNOSIS — E119 Type 2 diabetes mellitus without complications: Secondary | ICD-10-CM | POA: Diagnosis not present

## 2019-03-27 DIAGNOSIS — K5900 Constipation, unspecified: Secondary | ICD-10-CM | POA: Diagnosis not present

## 2019-03-27 DIAGNOSIS — E291 Testicular hypofunction: Secondary | ICD-10-CM | POA: Diagnosis not present

## 2019-03-27 DIAGNOSIS — K2100 Gastro-esophageal reflux disease with esophagitis, without bleeding: Secondary | ICD-10-CM | POA: Diagnosis not present

## 2019-03-27 DIAGNOSIS — D649 Anemia, unspecified: Secondary | ICD-10-CM | POA: Diagnosis not present

## 2019-03-29 DIAGNOSIS — K5900 Constipation, unspecified: Secondary | ICD-10-CM | POA: Diagnosis not present

## 2019-03-29 DIAGNOSIS — Z87891 Personal history of nicotine dependence: Secondary | ICD-10-CM | POA: Diagnosis not present

## 2019-03-29 DIAGNOSIS — E119 Type 2 diabetes mellitus without complications: Secondary | ICD-10-CM | POA: Diagnosis not present

## 2019-03-29 DIAGNOSIS — E291 Testicular hypofunction: Secondary | ICD-10-CM | POA: Diagnosis not present

## 2019-03-29 DIAGNOSIS — D649 Anemia, unspecified: Secondary | ICD-10-CM | POA: Diagnosis not present

## 2019-03-29 DIAGNOSIS — K2100 Gastro-esophageal reflux disease with esophagitis, without bleeding: Secondary | ICD-10-CM | POA: Diagnosis not present

## 2019-04-03 DIAGNOSIS — E291 Testicular hypofunction: Secondary | ICD-10-CM | POA: Diagnosis not present

## 2019-04-03 DIAGNOSIS — Z87891 Personal history of nicotine dependence: Secondary | ICD-10-CM | POA: Diagnosis not present

## 2019-04-03 DIAGNOSIS — E119 Type 2 diabetes mellitus without complications: Secondary | ICD-10-CM | POA: Diagnosis not present

## 2019-04-03 DIAGNOSIS — D649 Anemia, unspecified: Secondary | ICD-10-CM | POA: Diagnosis not present

## 2019-04-03 DIAGNOSIS — K2100 Gastro-esophageal reflux disease with esophagitis, without bleeding: Secondary | ICD-10-CM | POA: Diagnosis not present

## 2019-04-03 DIAGNOSIS — K5900 Constipation, unspecified: Secondary | ICD-10-CM | POA: Diagnosis not present

## 2019-04-05 DIAGNOSIS — D649 Anemia, unspecified: Secondary | ICD-10-CM | POA: Diagnosis not present

## 2019-04-05 DIAGNOSIS — Z87891 Personal history of nicotine dependence: Secondary | ICD-10-CM | POA: Diagnosis not present

## 2019-04-05 DIAGNOSIS — E119 Type 2 diabetes mellitus without complications: Secondary | ICD-10-CM | POA: Diagnosis not present

## 2019-04-05 DIAGNOSIS — K2100 Gastro-esophageal reflux disease with esophagitis, without bleeding: Secondary | ICD-10-CM | POA: Diagnosis not present

## 2019-04-05 DIAGNOSIS — K5900 Constipation, unspecified: Secondary | ICD-10-CM | POA: Diagnosis not present

## 2019-04-05 DIAGNOSIS — E291 Testicular hypofunction: Secondary | ICD-10-CM | POA: Diagnosis not present

## 2019-04-10 DIAGNOSIS — K5900 Constipation, unspecified: Secondary | ICD-10-CM | POA: Diagnosis not present

## 2019-04-10 DIAGNOSIS — K2100 Gastro-esophageal reflux disease with esophagitis, without bleeding: Secondary | ICD-10-CM | POA: Diagnosis not present

## 2019-04-10 DIAGNOSIS — D649 Anemia, unspecified: Secondary | ICD-10-CM | POA: Diagnosis not present

## 2019-04-10 DIAGNOSIS — Z87891 Personal history of nicotine dependence: Secondary | ICD-10-CM | POA: Diagnosis not present

## 2019-04-10 DIAGNOSIS — E291 Testicular hypofunction: Secondary | ICD-10-CM | POA: Diagnosis not present

## 2019-04-10 DIAGNOSIS — E119 Type 2 diabetes mellitus without complications: Secondary | ICD-10-CM | POA: Diagnosis not present

## 2019-04-12 DIAGNOSIS — K2100 Gastro-esophageal reflux disease with esophagitis, without bleeding: Secondary | ICD-10-CM | POA: Diagnosis not present

## 2019-04-12 DIAGNOSIS — D649 Anemia, unspecified: Secondary | ICD-10-CM | POA: Diagnosis not present

## 2019-04-12 DIAGNOSIS — E291 Testicular hypofunction: Secondary | ICD-10-CM | POA: Diagnosis not present

## 2019-04-12 DIAGNOSIS — Z87891 Personal history of nicotine dependence: Secondary | ICD-10-CM | POA: Diagnosis not present

## 2019-04-12 DIAGNOSIS — E119 Type 2 diabetes mellitus without complications: Secondary | ICD-10-CM | POA: Diagnosis not present

## 2019-04-12 DIAGNOSIS — K5900 Constipation, unspecified: Secondary | ICD-10-CM | POA: Diagnosis not present

## 2019-04-17 DIAGNOSIS — K2100 Gastro-esophageal reflux disease with esophagitis, without bleeding: Secondary | ICD-10-CM | POA: Diagnosis not present

## 2019-04-17 DIAGNOSIS — E291 Testicular hypofunction: Secondary | ICD-10-CM | POA: Diagnosis not present

## 2019-04-17 DIAGNOSIS — E119 Type 2 diabetes mellitus without complications: Secondary | ICD-10-CM | POA: Diagnosis not present

## 2019-04-17 DIAGNOSIS — Z87891 Personal history of nicotine dependence: Secondary | ICD-10-CM | POA: Diagnosis not present

## 2019-04-17 DIAGNOSIS — K5900 Constipation, unspecified: Secondary | ICD-10-CM | POA: Diagnosis not present

## 2019-04-17 DIAGNOSIS — D649 Anemia, unspecified: Secondary | ICD-10-CM | POA: Diagnosis not present

## 2019-04-19 DIAGNOSIS — E119 Type 2 diabetes mellitus without complications: Secondary | ICD-10-CM | POA: Diagnosis not present

## 2019-04-19 DIAGNOSIS — E291 Testicular hypofunction: Secondary | ICD-10-CM | POA: Diagnosis not present

## 2019-04-19 DIAGNOSIS — K2100 Gastro-esophageal reflux disease with esophagitis, without bleeding: Secondary | ICD-10-CM | POA: Diagnosis not present

## 2019-04-19 DIAGNOSIS — Z87891 Personal history of nicotine dependence: Secondary | ICD-10-CM | POA: Diagnosis not present

## 2019-04-19 DIAGNOSIS — K5900 Constipation, unspecified: Secondary | ICD-10-CM | POA: Diagnosis not present

## 2019-04-19 DIAGNOSIS — D649 Anemia, unspecified: Secondary | ICD-10-CM | POA: Diagnosis not present

## 2019-04-23 DIAGNOSIS — E119 Type 2 diabetes mellitus without complications: Secondary | ICD-10-CM | POA: Diagnosis not present

## 2019-04-23 DIAGNOSIS — Z87891 Personal history of nicotine dependence: Secondary | ICD-10-CM | POA: Diagnosis not present

## 2019-04-23 DIAGNOSIS — D649 Anemia, unspecified: Secondary | ICD-10-CM | POA: Diagnosis not present

## 2019-04-23 DIAGNOSIS — K2100 Gastro-esophageal reflux disease with esophagitis, without bleeding: Secondary | ICD-10-CM | POA: Diagnosis not present

## 2019-04-23 DIAGNOSIS — K5900 Constipation, unspecified: Secondary | ICD-10-CM | POA: Diagnosis not present

## 2019-04-23 DIAGNOSIS — E291 Testicular hypofunction: Secondary | ICD-10-CM | POA: Diagnosis not present

## 2019-04-26 DIAGNOSIS — E119 Type 2 diabetes mellitus without complications: Secondary | ICD-10-CM | POA: Diagnosis not present

## 2019-04-26 DIAGNOSIS — K5900 Constipation, unspecified: Secondary | ICD-10-CM | POA: Diagnosis not present

## 2019-04-26 DIAGNOSIS — Z87891 Personal history of nicotine dependence: Secondary | ICD-10-CM | POA: Diagnosis not present

## 2019-04-26 DIAGNOSIS — E291 Testicular hypofunction: Secondary | ICD-10-CM | POA: Diagnosis not present

## 2019-04-26 DIAGNOSIS — D649 Anemia, unspecified: Secondary | ICD-10-CM | POA: Diagnosis not present

## 2019-04-26 DIAGNOSIS — K2100 Gastro-esophageal reflux disease with esophagitis, without bleeding: Secondary | ICD-10-CM | POA: Diagnosis not present

## 2019-04-30 DIAGNOSIS — Z87891 Personal history of nicotine dependence: Secondary | ICD-10-CM | POA: Diagnosis not present

## 2019-04-30 DIAGNOSIS — K5900 Constipation, unspecified: Secondary | ICD-10-CM | POA: Diagnosis not present

## 2019-04-30 DIAGNOSIS — E291 Testicular hypofunction: Secondary | ICD-10-CM | POA: Diagnosis not present

## 2019-04-30 DIAGNOSIS — E119 Type 2 diabetes mellitus without complications: Secondary | ICD-10-CM | POA: Diagnosis not present

## 2019-04-30 DIAGNOSIS — D649 Anemia, unspecified: Secondary | ICD-10-CM | POA: Diagnosis not present

## 2019-04-30 DIAGNOSIS — K2100 Gastro-esophageal reflux disease with esophagitis, without bleeding: Secondary | ICD-10-CM | POA: Diagnosis not present

## 2019-05-03 DIAGNOSIS — E119 Type 2 diabetes mellitus without complications: Secondary | ICD-10-CM | POA: Diagnosis not present

## 2019-05-03 DIAGNOSIS — Z87891 Personal history of nicotine dependence: Secondary | ICD-10-CM | POA: Diagnosis not present

## 2019-05-03 DIAGNOSIS — K5900 Constipation, unspecified: Secondary | ICD-10-CM | POA: Diagnosis not present

## 2019-05-03 DIAGNOSIS — K2100 Gastro-esophageal reflux disease with esophagitis, without bleeding: Secondary | ICD-10-CM | POA: Diagnosis not present

## 2019-05-03 DIAGNOSIS — D649 Anemia, unspecified: Secondary | ICD-10-CM | POA: Diagnosis not present

## 2019-05-03 DIAGNOSIS — E291 Testicular hypofunction: Secondary | ICD-10-CM | POA: Diagnosis not present

## 2019-06-21 DIAGNOSIS — K5900 Constipation, unspecified: Secondary | ICD-10-CM | POA: Diagnosis not present

## 2019-06-21 DIAGNOSIS — R739 Hyperglycemia, unspecified: Secondary | ICD-10-CM | POA: Diagnosis not present

## 2019-06-21 DIAGNOSIS — E1165 Type 2 diabetes mellitus with hyperglycemia: Secondary | ICD-10-CM | POA: Diagnosis not present

## 2019-06-21 DIAGNOSIS — K644 Residual hemorrhoidal skin tags: Secondary | ICD-10-CM | POA: Diagnosis not present

## 2019-06-21 DIAGNOSIS — D649 Anemia, unspecified: Secondary | ICD-10-CM | POA: Diagnosis not present

## 2019-06-21 DIAGNOSIS — H9313 Tinnitus, bilateral: Secondary | ICD-10-CM | POA: Diagnosis not present

## 2019-06-28 DIAGNOSIS — E119 Type 2 diabetes mellitus without complications: Secondary | ICD-10-CM | POA: Diagnosis not present

## 2019-06-28 DIAGNOSIS — Z23 Encounter for immunization: Secondary | ICD-10-CM | POA: Diagnosis not present

## 2019-06-28 DIAGNOSIS — H9313 Tinnitus, bilateral: Secondary | ICD-10-CM | POA: Diagnosis not present

## 2019-06-28 DIAGNOSIS — R2681 Unsteadiness on feet: Secondary | ICD-10-CM | POA: Diagnosis not present

## 2019-06-28 DIAGNOSIS — K5900 Constipation, unspecified: Secondary | ICD-10-CM | POA: Diagnosis not present

## 2019-06-28 DIAGNOSIS — Z0000 Encounter for general adult medical examination without abnormal findings: Secondary | ICD-10-CM | POA: Diagnosis not present

## 2019-06-28 DIAGNOSIS — H9193 Unspecified hearing loss, bilateral: Secondary | ICD-10-CM | POA: Diagnosis not present

## 2019-06-28 DIAGNOSIS — L570 Actinic keratosis: Secondary | ICD-10-CM | POA: Diagnosis not present

## 2019-06-28 DIAGNOSIS — N400 Enlarged prostate without lower urinary tract symptoms: Secondary | ICD-10-CM | POA: Diagnosis not present

## 2019-12-14 ENCOUNTER — Other Ambulatory Visit: Payer: Self-pay

## 2019-12-14 ENCOUNTER — Encounter: Payer: Self-pay | Admitting: Emergency Medicine

## 2019-12-14 ENCOUNTER — Emergency Department: Payer: Medicare HMO

## 2019-12-14 ENCOUNTER — Emergency Department
Admission: EM | Admit: 2019-12-14 | Discharge: 2019-12-14 | Disposition: A | Discharge status: Home / Self Care | Payer: Medicare HMO | Attending: Emergency Medicine | Admitting: Emergency Medicine

## 2019-12-14 DIAGNOSIS — W010XXA Fall on same level from slipping, tripping and stumbling without subsequent striking against object, initial encounter: Secondary | ICD-10-CM | POA: Insufficient documentation

## 2019-12-14 DIAGNOSIS — S0101XA Laceration without foreign body of scalp, initial encounter: Secondary | ICD-10-CM | POA: Diagnosis not present

## 2019-12-14 DIAGNOSIS — Y999 Unspecified external cause status: Secondary | ICD-10-CM | POA: Insufficient documentation

## 2019-12-14 DIAGNOSIS — Z87891 Personal history of nicotine dependence: Secondary | ICD-10-CM | POA: Diagnosis not present

## 2019-12-14 DIAGNOSIS — Z23 Encounter for immunization: Secondary | ICD-10-CM | POA: Insufficient documentation

## 2019-12-14 DIAGNOSIS — S0990XA Unspecified injury of head, initial encounter: Secondary | ICD-10-CM | POA: Diagnosis not present

## 2019-12-14 DIAGNOSIS — Y9301 Activity, walking, marching and hiking: Secondary | ICD-10-CM | POA: Diagnosis not present

## 2019-12-14 DIAGNOSIS — Y92019 Unspecified place in single-family (private) house as the place of occurrence of the external cause: Secondary | ICD-10-CM | POA: Diagnosis not present

## 2019-12-14 DIAGNOSIS — S098XXA Other specified injuries of head, initial encounter: Secondary | ICD-10-CM | POA: Diagnosis not present

## 2019-12-14 DIAGNOSIS — S0181XA Laceration without foreign body of other part of head, initial encounter: Secondary | ICD-10-CM | POA: Diagnosis not present

## 2019-12-14 DIAGNOSIS — W19XXXA Unspecified fall, initial encounter: Secondary | ICD-10-CM | POA: Diagnosis not present

## 2019-12-14 DIAGNOSIS — R58 Hemorrhage, not elsewhere classified: Secondary | ICD-10-CM | POA: Diagnosis not present

## 2019-12-14 LAB — CBC
HCT: 38.9 % — ABNORMAL LOW (ref 39.0–52.0)
Hemoglobin: 13.1 g/dL (ref 13.0–17.0)
MCH: 32 pg (ref 26.0–34.0)
MCHC: 33.7 g/dL (ref 30.0–36.0)
MCV: 94.9 fL (ref 80.0–100.0)
Platelets: 156 10*3/uL (ref 150–400)
RBC: 4.1 MIL/uL — ABNORMAL LOW (ref 4.22–5.81)
RDW: 12.6 % (ref 11.5–15.5)
WBC: 5.5 10*3/uL (ref 4.0–10.5)
nRBC: 0 % (ref 0.0–0.2)

## 2019-12-14 LAB — BASIC METABOLIC PANEL
Anion gap: 10 (ref 5–15)
BUN: 18 mg/dL (ref 8–23)
CO2: 25 mmol/L (ref 22–32)
Calcium: 9.6 mg/dL (ref 8.9–10.3)
Chloride: 104 mmol/L (ref 98–111)
Creatinine, Ser: 1.23 mg/dL (ref 0.61–1.24)
GFR calc Af Amer: >60 mL/min (ref >60)
GFR calc non Af Amer: 54 mL/min — ABNORMAL LOW (ref >60)
Glucose, Bld: 195 mg/dL — ABNORMAL HIGH (ref 70–99)
Potassium: 4.4 mmol/L (ref 3.5–5.1)
Sodium: 139 mmol/L (ref 135–145)

## 2019-12-14 MED ORDER — TETANUS-DIPHTH-ACELL PERTUSSIS 5-2.5-18.5 LF-MCG/0.5 IM SUSP
0.5000 mL | Freq: Once | INTRAMUSCULAR | Status: AC
  Administered 2019-12-14: 0.5 mL via INTRAMUSCULAR
  Filled 2019-12-14: qty 0.5

## 2019-12-14 NOTE — ED Notes (Signed)
Patient declined discharge vital signs. 

## 2019-12-14 NOTE — Discharge Instructions (Addendum)
Please keep dressing on for 24 hours.  After 24 hours you may shower and get wet to be careful with scrubbing the scalp, be careful not to remove scab from the laceration site as this may cause bleeding to recur.  Follow-up with primary care provider walk-in clinic in 7 to 10 days for staple removal and Steri-Strip application.  Return to the ER for any severe sudden onset of headache, nausea or vomiting.

## 2019-12-14 NOTE — ED Triage Notes (Signed)
Pt was asleep in chair and got up to go out to mailman.  Fell because legs felt weak.  hematoma to top of head.  No LOC. No blood thinners.

## 2019-12-14 NOTE — ED Provider Notes (Signed)
[facility] EMERGENCY DEPARTMENT Provider Note   CSN: [id number] Arrival date & time: 12/14/19  1522     History Chief Complaint  Patient presents with  . Fall    Troy Preston is a 86 y.o. male presents to the emergency department for evaluation of head injury, laceration to the scalp.  Patient fell just prior to arrival, fell backwards as he tripped, hit his head on a table.  Denies any LOC, nausea or vomiting.  Has slight headache along the area of laceration site.  He is not on blood thinners.  Denies any neck pain numbness tingling radicular symptoms.  Pain is mild.  Tetanus is not up-to-date. HPI     Past Medical History:  Diagnosis Date  . Constipation   . Constipation   . Esophagitis   . Esophagitis   . Impaired glucose tolerance   . Impaired glucose tolerance   . Tinnitus   . Tinnitus   . Urinary frequency   . Urinary frequency     Patient Active Problem List   Diagnosis Date Noted  . Hypogonadism in male 12/28/2014  . 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 Gallstone 12/28/2014  . Blood glucose elevated 09/20/2014  . External hemorrhoid 09/20/2014  . CN (constipation) 05/15/2014  . Esophagitis 05/15/2014  . Borderline diabetes 05/15/2014  . Buzzing in ear 05/15/2014    Past Surgical History:  Procedure Laterality Date  . Palmer fascietomy and release of Dupuytren's contracture     right little finger, left ring and little fingers       Family History  Problem Relation Age of Onset  . Cancer Other   . Diabetes Mellitus II Mother   . Heart attack Father     Social History   Tobacco Use  . Smoking status: Former Research scientist (life sciences)  . Smokeless tobacco: Never Used  Substance Use Topics  . Alcohol use: No    Alcohol/week: 0.0 standard drinks  . Drug use: Not on file    Home Medications Prior to Admission medications   Medication Sig Start Date End Date Taking? Authorizing Provider  acetaminophen  (TYLENOL) 325 MG tablet Take 650 mg by mouth every 6 (six) hours as needed.    [provider]  bisacodyl (STIMULANT LAXATIVE) 5 MG EC tablet Take 5 mg by mouth.    [provider]  Cinnamon 500 MG capsule Take 500 mg by mouth.    [provider]  cyanocobalamin (V-R VITAMIN B-12) 500 MCG tablet Take by mouth.    [provider]  ibuprofen (ADVIL,MOTRIN) 100 MG tablet Take 100 mg by mouth every 6 (six) hours as needed for fever.    [provider]  LORazepam (ATIVAN) 1 MG tablet take 1 pill 60 min before MRI and 2nd pill 15 min before MRI if needed. Patient should not drive when under influence of medicine. 11/06/14   [provider]  omeprazole (PRILOSEC) 20 MG capsule Take 20 mg by mouth.    [provider]  Testosterone (ANDROGEL) 20.25 MG/1.25GM (1.62%) GEL Apply 20 each topically daily. Patient needs to apply 2 pumps daily 12/26/14   Zara Council A, PA-C    Allergies    Patient has no known allergies.  Review of Systems   Review of Systems  Constitutional: Negative for chills and fever.  Respiratory: Negative for shortness of breath.   Cardiovascular: Negative for chest pain.  Musculoskeletal: Negative for arthralgias, back pain, gait problem, joint swelling and neck  pain.  Skin: Positive for wound.  Neurological: Positive for headaches. Negative for dizziness and light-headedness.    Physical Exam Updated Vital Signs BP (!) 145/59 (BP Location: Right Arm)   Pulse 61   Temp 98 F (36.7 C) (Oral)   Resp 18   Ht 5\' 6"  (1.676 m)   Wt 81.6 kg   SpO2 100%   BMI 29.05 kg/m   Physical Exam Constitutional:      Appearance: He is well-developed.  HENT:     Head: Normocephalic.     Comments: 4 cm laceration to the occipital region of the scalp.  No visible or palpable foreign body.  No significant hematoma present.  Bleeding well controlled.    Right Ear: External ear normal.     Left Ear: External ear normal.      Nose: Nose normal.  Eyes:     Extraocular Movements: Extraocular movements intact.     Conjunctiva/sclera: Conjunctivae normal.     Pupils: Pupils are equal, round, and reactive to light.  Cardiovascular:     Rate and Rhythm: Normal rate.  Pulmonary:     Effort: Pulmonary effort is normal. No respiratory distress.  Abdominal:     General: There is no distension.     Tenderness: There is no abdominal tenderness. There is no guarding.  Musculoskeletal:        General: Normal range of motion.     Cervical back: Normal range of motion.  Skin:    General: Skin is warm.     Findings: No rash.  Neurological:     General: No focal deficit present.     Mental Status: He is alert and oriented to person, place, and time. Mental status is at baseline.     Cranial Nerves: No cranial nerve deficit.     Motor: No weakness.     Gait: Gait normal.  Psychiatric:        Mood and Affect: Mood normal.        Behavior: Behavior normal.        Thought Content: Thought content normal.     ED Results / Procedures / Treatments   Labs (all labs ordered are listed, but only abnormal results are displayed) Labs Reviewed  BASIC METABOLIC PANEL - Abnormal; Notable for the following components:      Result Value   Glucose, Bld 195 (*)    GFR calc non Af Amer 54 (*)    All other components within normal limits  CBC - Abnormal; Notable for the following components:   RBC 4.10 (*)    HCT 38.9 (*)    All other components within normal limits  URINALYSIS, COMPLETE (UACMP) WITH MICROSCOPIC    EKG None  Radiology CT HEAD WO CONTRAST  Result Date: 12/14/2019 CLINICAL DATA:  Head trauma. Laceration to the back and head. EXAM: CT HEAD WITHOUT CONTRAST TECHNIQUE: Contiguous axial images were obtained from the base of the skull through the vertex without intravenous contrast. COMPARISON:  MRI head dated 02/13/2015 FINDINGS: Brain: No evidence of acute infarction, hemorrhage, hydrocephalus, extra-axial  collection or mass lesion/mass effect. Atrophy and chronic microvascular ischemic changes are noted. Vascular: No hyperdense vessel or unexpected calcification. Skull: Normal. Negative for fracture or focal lesion. Sinuses/Orbits: No acute finding. Other: None. IMPRESSION: No acute intracranial abnormality. Electronically Signed   By: 02/15/2015 M.D.   On: 12/14/2019 16:31    Procedures .05/30/2021Laceration Repair  Date/Time: 12/14/2019 9:40 PM Performed by: 12/16/2019, PA-C Authorized  by: Evon Slack, PA-C   Consent:    Consent obtained:  Verbal   Consent given by:  Patient   Alternatives discussed:  No treatment Anesthesia (see MAR for exact dosages):    Anesthesia method:  None Laceration details:    Location:  Scalp   Scalp location:  Occipital   Length (cm):  4   Depth (mm):  2 Repair type:    Repair type:  Simple Treatment:    Area cleansed with:  Betadine and saline   Irrigation solution:  Sterile saline   Irrigation method:  Tap   Visualized foreign bodies/material removed: no   Skin repair:    Repair method:  Staples   Number of staples:  4 Approximation:    Approximation:  Close Post-procedure details:    Dressing:  Bulky dressing and non-adherent dressing   Patient tolerance of procedure:  Tolerated well, no immediate complications   (including critical care time)  Medications Ordered in ED Medications  Tdap (BOOSTRIX) injection 0.5 mL (has no administration in time range)    ED Course  I have reviewed the triage vital signs and the nursing notes.  Pertinent labs & imaging results that were available during my care of the patient were reviewed by me and considered in my medical decision making (see chart for details).    MDM Rules/Calculators/A&P                      86 year old male with head injury earlier today.  No LOC, nausea or vomiting.  He is not on blood thinners.  CT of the head negative.  Tetanus updated.  Laceration repaired with  staples.  He is educated on wound care and signs and symptoms return to the ED for. Final Clinical Impression(s) / ED Diagnoses Final diagnoses:  Injury of head, initial encounter  Laceration of scalp, initial encounter    Rx / DC Orders ED Discharge Orders    None       Ronnette Juniper 12/14/19 2142    Phineas Semen, MD 12/14/19 2330

## 2019-12-14 NOTE — ED Triage Notes (Signed)
Pt in via EMS from home with c/o fall and laceration. Pt was asleep and woke up to the mailman so he chased him and fell. Pt with laceration to back of head. Pt also reports weakness to legs. No LOC.

## 2019-12-20 DIAGNOSIS — Z9189 Other specified personal risk factors, not elsewhere classified: Secondary | ICD-10-CM | POA: Diagnosis not present

## 2019-12-20 DIAGNOSIS — E1165 Type 2 diabetes mellitus with hyperglycemia: Secondary | ICD-10-CM | POA: Diagnosis not present

## 2019-12-20 DIAGNOSIS — H9313 Tinnitus, bilateral: Secondary | ICD-10-CM | POA: Diagnosis not present

## 2019-12-20 DIAGNOSIS — K5900 Constipation, unspecified: Secondary | ICD-10-CM | POA: Diagnosis not present

## 2019-12-20 DIAGNOSIS — Z0000 Encounter for general adult medical examination without abnormal findings: Secondary | ICD-10-CM | POA: Diagnosis not present

## 2019-12-20 DIAGNOSIS — Z125 Encounter for screening for malignant neoplasm of prostate: Secondary | ICD-10-CM | POA: Diagnosis not present

## 2019-12-21 DIAGNOSIS — Z125 Encounter for screening for malignant neoplasm of prostate: Secondary | ICD-10-CM | POA: Diagnosis not present

## 2019-12-21 DIAGNOSIS — E1165 Type 2 diabetes mellitus with hyperglycemia: Secondary | ICD-10-CM | POA: Diagnosis not present

## 2019-12-21 DIAGNOSIS — Z0000 Encounter for general adult medical examination without abnormal findings: Secondary | ICD-10-CM | POA: Diagnosis not present

## 2019-12-21 DIAGNOSIS — K5900 Constipation, unspecified: Secondary | ICD-10-CM | POA: Diagnosis not present

## 2019-12-21 DIAGNOSIS — Z9189 Other specified personal risk factors, not elsewhere classified: Secondary | ICD-10-CM | POA: Diagnosis not present

## 2019-12-21 DIAGNOSIS — H9313 Tinnitus, bilateral: Secondary | ICD-10-CM | POA: Diagnosis not present

## 2019-12-27 DIAGNOSIS — W01198A Fall on same level from slipping, tripping and stumbling with subsequent striking against other object, initial encounter: Secondary | ICD-10-CM | POA: Diagnosis not present

## 2019-12-27 DIAGNOSIS — E1165 Type 2 diabetes mellitus with hyperglycemia: Secondary | ICD-10-CM | POA: Diagnosis not present

## 2019-12-27 DIAGNOSIS — S0181XA Laceration without foreign body of other part of head, initial encounter: Secondary | ICD-10-CM | POA: Diagnosis not present

## 2019-12-27 DIAGNOSIS — Z4802 Encounter for removal of sutures: Secondary | ICD-10-CM | POA: Diagnosis not present

## 2019-12-27 DIAGNOSIS — D649 Anemia, unspecified: Secondary | ICD-10-CM | POA: Diagnosis not present

## 2020-01-04 DIAGNOSIS — R0982 Postnasal drip: Secondary | ICD-10-CM | POA: Diagnosis not present

## 2020-01-04 DIAGNOSIS — K2100 Gastro-esophageal reflux disease with esophagitis, without bleeding: Secondary | ICD-10-CM | POA: Diagnosis not present

## 2020-01-04 DIAGNOSIS — R1310 Dysphagia, unspecified: Secondary | ICD-10-CM | POA: Diagnosis not present

## 2020-04-22 DIAGNOSIS — J209 Acute bronchitis, unspecified: Secondary | ICD-10-CM | POA: Diagnosis not present

## 2020-04-22 DIAGNOSIS — R29898 Other symptoms and signs involving the musculoskeletal system: Secondary | ICD-10-CM | POA: Diagnosis not present

## 2020-04-22 DIAGNOSIS — K5900 Constipation, unspecified: Secondary | ICD-10-CM | POA: Diagnosis not present

## 2020-04-22 DIAGNOSIS — E119 Type 2 diabetes mellitus without complications: Secondary | ICD-10-CM | POA: Diagnosis not present

## 2020-06-20 DIAGNOSIS — D649 Anemia, unspecified: Secondary | ICD-10-CM | POA: Diagnosis not present

## 2020-06-20 DIAGNOSIS — Z9181 History of falling: Secondary | ICD-10-CM | POA: Diagnosis not present

## 2020-06-20 DIAGNOSIS — E1165 Type 2 diabetes mellitus with hyperglycemia: Secondary | ICD-10-CM | POA: Diagnosis not present

## 2020-06-20 DIAGNOSIS — Z4802 Encounter for removal of sutures: Secondary | ICD-10-CM | POA: Diagnosis not present

## 2020-06-27 DIAGNOSIS — K219 Gastro-esophageal reflux disease without esophagitis: Secondary | ICD-10-CM | POA: Diagnosis not present

## 2020-06-27 DIAGNOSIS — E119 Type 2 diabetes mellitus without complications: Secondary | ICD-10-CM | POA: Diagnosis not present

## 2020-06-27 DIAGNOSIS — D649 Anemia, unspecified: Secondary | ICD-10-CM | POA: Diagnosis not present

## 2020-06-27 DIAGNOSIS — R2681 Unsteadiness on feet: Secondary | ICD-10-CM | POA: Diagnosis not present

## 2020-06-27 DIAGNOSIS — Z79899 Other long term (current) drug therapy: Secondary | ICD-10-CM | POA: Diagnosis not present

## 2020-06-27 DIAGNOSIS — R29898 Other symptoms and signs involving the musculoskeletal system: Secondary | ICD-10-CM | POA: Diagnosis not present

## 2020-07-20 ENCOUNTER — Emergency Department: Payer: Medicare HMO

## 2020-07-20 ENCOUNTER — Emergency Department
Admission: EM | Admit: 2020-07-20 | Discharge: 2020-07-20 | Disposition: A | Discharge status: Home / Self Care | Payer: Medicare HMO | Attending: Emergency Medicine | Admitting: Emergency Medicine

## 2020-07-20 ENCOUNTER — Other Ambulatory Visit: Payer: Self-pay

## 2020-07-20 ENCOUNTER — Encounter: Payer: Self-pay | Admitting: Emergency Medicine

## 2020-07-20 DIAGNOSIS — R58 Hemorrhage, not elsewhere classified: Secondary | ICD-10-CM | POA: Diagnosis not present

## 2020-07-20 DIAGNOSIS — S0990XA Unspecified injury of head, initial encounter: Secondary | ICD-10-CM

## 2020-07-20 DIAGNOSIS — Y92009 Unspecified place in unspecified non-institutional (private) residence as the place of occurrence of the external cause: Secondary | ICD-10-CM | POA: Insufficient documentation

## 2020-07-20 DIAGNOSIS — S0101XA Laceration without foreign body of scalp, initial encounter: Secondary | ICD-10-CM | POA: Diagnosis not present

## 2020-07-20 DIAGNOSIS — G319 Degenerative disease of nervous system, unspecified: Secondary | ICD-10-CM | POA: Diagnosis not present

## 2020-07-20 DIAGNOSIS — S1191XA Laceration without foreign body of unspecified part of neck, initial encounter: Secondary | ICD-10-CM | POA: Diagnosis not present

## 2020-07-20 DIAGNOSIS — W01198A Fall on same level from slipping, tripping and stumbling with subsequent striking against other object, initial encounter: Secondary | ICD-10-CM | POA: Diagnosis not present

## 2020-07-20 DIAGNOSIS — Z87891 Personal history of nicotine dependence: Secondary | ICD-10-CM | POA: Insufficient documentation

## 2020-07-20 DIAGNOSIS — R531 Weakness: Secondary | ICD-10-CM | POA: Diagnosis not present

## 2020-07-20 DIAGNOSIS — I739 Peripheral vascular disease, unspecified: Secondary | ICD-10-CM | POA: Diagnosis not present

## 2020-07-20 DIAGNOSIS — J984 Other disorders of lung: Secondary | ICD-10-CM | POA: Diagnosis not present

## 2020-07-20 DIAGNOSIS — W19XXXA Unspecified fall, initial encounter: Secondary | ICD-10-CM | POA: Diagnosis not present

## 2020-07-20 NOTE — ED Triage Notes (Signed)
Pt to ED via [facility] from home for fall. Pt states that he was in the bathroom and his legs gave out on him. Pt states that he hit his head. Pt has laceration to the back of the head. Bleeding is controlled at this time. Pt states that fall happened around 0600

## 2020-07-20 NOTE — Discharge Instructions (Signed)
Do not wash your hair for at least 24 hours.  For the next week, please be careful when combing and drying your hair with the towel.  Return to the ER or see Primary Care for concerns.

## 2020-07-20 NOTE — ED Triage Notes (Signed)
Pt in via EMS with c/o fall. Pt fell 2-3 hours ago and has a laceration on back of his head. Pt reports legs are weak.  133/73, 72 HR, 98%, FSBS 233, has not taken meds this am

## 2020-07-20 NOTE — ED Provider Notes (Signed)
[facility] Emergency Department Provider Note ____________________________________________   Event Date/Time   First MD Initiated Contact with Patient 07/20/20 1514     (approximate)  I have reviewed the triage vital signs and the nursing notes.   HISTORY  Chief Complaint Fall  HPI Troy Preston is a 87 y.o. male with history as listed below presents to the emergency department for treatment and evaluation after fall at home. He states that his legs got weak and just gave out. He his his head on the tub. No loss of consciousness. He does have a scalp laceration. Bleeding well controlled.       Past Medical History:  Diagnosis Date  . Constipation   . Constipation   . Esophagitis   . Esophagitis   . Impaired glucose tolerance   . Impaired glucose tolerance   . Tinnitus   . Tinnitus   . Urinary frequency   . Urinary frequency     Patient Active Problem List   Diagnosis Date Noted  . Hypogonadism in male 12/28/2014  . 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 Gallstone 12/28/2014  . Blood glucose elevated 09/20/2014  . External hemorrhoid 09/20/2014  . CN (constipation) 05/15/2014  . Esophagitis 05/15/2014  . Borderline diabetes 05/15/2014  . Buzzing in ear 05/15/2014    Past Surgical History:  Procedure Laterality Date  . Palmer fascietomy and release of Dupuytren's contracture     right little finger, left ring and little fingers    Prior to Admission medications   Medication Sig Start Date End Date Taking? Authorizing Provider  acetaminophen (TYLENOL) 325 MG tablet Take 650 mg by mouth every 6 (six) hours as needed.    [provider]  bisacodyl (STIMULANT LAXATIVE) 5 MG EC tablet Take 5 mg by mouth.    [provider]  Cinnamon 500 MG capsule Take 500 mg by mouth.    [provider]  cyanocobalamin (V-R VITAMIN B-12) 500 MCG tablet Take by mouth.    [provider]   ibuprofen (ADVIL,MOTRIN) 100 MG tablet Take 100 mg by mouth every 6 (six) hours as needed for fever.    [provider]  LORazepam (ATIVAN) 1 MG tablet take 1 pill 60 min before MRI and 2nd pill 15 min before MRI if needed. Patient should not drive when under influence of medicine. 11/06/14   [provider]  omeprazole (PRILOSEC) 20 MG capsule Take 20 mg by mouth.    [provider]  Testosterone (ANDROGEL) 20.25 MG/1.25GM (1.62%) GEL Apply 20 each topically daily. Patient needs to apply 2 pumps daily 12/26/14   Michiel Cowboy A, PA-C    Allergies Patient has no known allergies.  Family History  Problem Relation Age of Onset  . Cancer Other   . Diabetes Mellitus II Mother   . Heart attack Father     Social History Social History   Tobacco Use  . Smoking status: Former Games developer  . Smokeless tobacco: Never Used  Substance Use Topics  . Alcohol use: No    Alcohol/week: 0.0 standard drinks    Review of Systems  Constitutional: No fever/chills Eyes: No visual changes. ENT: No sore throat. Cardiovascular: Denies chest pain. Respiratory: Denies shortness of breath. Gastrointestinal: No abdominal pain.  No nausea, no vomiting.  No diarrhea.  No constipation. Genitourinary: Negative for dysuria. Musculoskeletal: Negative for back pain. Skin: Positive for scalp laceration. Neurological: Negative for headaches, focal weakness or numbness. ____________________________________________   PHYSICAL  EXAM:  VITAL SIGNS: ED Triage Vitals [07/20/20 1047]  Enc Vitals Group     BP (!) 102/55     Pulse Rate 70     Resp 16     Temp 98.4 F (36.9 C)     Temp Source Oral     SpO2 100 %     Weight 175 lb (79.4 kg)     Height 5\' 6"  (1.676 m)     Head Circumference      Peak Flow      Pain Score 0     Pain Loc      Pain Edu?      Excl. in [place]?     Constitutional: Alert and oriented. Well appearing and in no acute distress. Eyes: Conjunctivae are normal.  PERRL. EOMI. Head: Atraumatic. Nose: No congestion/rhinnorhea. Mouth/Throat: Mucous membranes are moist.  Oropharynx non-erythematous. Neck: No stridor.   Hematological/Lymphatic/Immunilogical: No cervical lymphadenopathy. Cardiovascular: Normal rate, regular rhythm. Grossly normal heart sounds.  Good peripheral circulation. Respiratory: Normal respiratory effort.  No retractions. Lungs CTAB. Gastrointestinal: Soft and nontender. No distention. No abdominal bruits. Genitourinary:  Musculoskeletal: No focal midline tenderness along the length of the spine.  No lower extremity tenderness nor edema.  No joint effusions. Neurologic:  Normal speech and language. No gross focal neurologic deficits are appreciated. No gait instability. Skin: 1.5 cm laceration to the parietal aspect of the right side of the scalp.  No active bleeding. Psychiatric: Mood and affect are normal. Speech and behavior are normal.  ____________________________________________   LABS (all labs ordered are listed, but only abnormal results are displayed)  Labs Reviewed - No data to display ____________________________________________  EKG  Not indicated ____________________________________________  RADIOLOGY  ED MD interpretation:    CT of the cervical spine and head are both negative for acute concerns.  I, Sherrie George, personally viewed and evaluated these images (plain radiographs) as part of my medical decision making, as well as reviewing the written report by the radiologist.  Official radiology report(s): CT Head Wo Contrast  Result Date: 07/20/2020 CLINICAL DATA:  Fall, posterior scalp laceration EXAM: CT HEAD WITHOUT CONTRAST TECHNIQUE: Contiguous axial images were obtained from the base of the skull through the vertex without intravenous contrast. COMPARISON:  12/14/2019 FINDINGS: Brain: There is atrophy and chronic small vessel disease changes. No acute intracranial abnormality. Specifically, no  hemorrhage, hydrocephalus, mass lesion, acute infarction, or significant intracranial injury. Vascular: No hyperdense vessel or unexpected calcification. Skull: No acute calvarial abnormality. Sinuses/Orbits: No acute findings Other: None IMPRESSION: Atrophy, chronic microvascular disease. No acute intracranial abnormality. Electronically Signed   By: Rolm Baptise M.D.   On: 07/20/2020 11:30   CT Cervical Spine Wo Contrast  Result Date: 07/20/2020 CLINICAL DATA:  Fall, posterior laceration EXAM: CT CERVICAL SPINE WITHOUT CONTRAST TECHNIQUE: Multidetector CT imaging of the cervical spine was performed without intravenous contrast. Multiplanar CT image reconstructions were also generated. COMPARISON:  None. FINDINGS: Alignment: Normal Skull base and vertebrae: No acute fracture. No primary bone lesion or focal pathologic process. Soft tissues and spinal canal: No prevertebral fluid or swelling. No visible canal hematoma. Disc levels: Maintained. Mild bilateral degenerative facet disease, right greater than left. Upper chest: Biapical scarring. Other: None IMPRESSION: No acute bony abnormality. Bilateral degenerative facet disease, right greater than left. Electronically Signed   By: Rolm Baptise M.D.   On: 07/20/2020 11:31    ____________________________________________   PROCEDURES  Procedure(s) performed (including Critical Care):  Marland KitchenMarland KitchenLaceration Repair  Date/Time: 07/20/2020 8:17  PM Performed by: Chinita Pester, FNP Authorized by: Chinita Pester, FNP   Consent:    Consent obtained:  Verbal   Consent given by:  Patient   Risks discussed:  Poor wound healing Treatment:    Area cleansed with:  Chlorhexidine and saline   Irrigation solution:  Sterile saline   Irrigation method:  Syringe Skin repair:    Repair method:  Tissue adhesive Approximation:    Approximation:  Close Repair type:    Repair type:  Simple Post-procedure details:    Dressing:  Open (no dressing)   Procedure  completion:  Tolerated well, no immediate complications    ____________________________________________   INITIAL IMPRESSION / ASSESSMENT AND PLAN     87 year old male presenting to the emergency department after falling in his bathroom at home.  See HPI for further details.  Patient has been awaiting ER room assignment for several hours.  He denies headache, he has not felt dizzy, experienced any change in vision, or had any nausea or vomiting.  He feels that he is at his baseline.  He denies chest pain, shortness of breath, or new weaknesses.  Scalp laceration no longer bleeding.  Area was cleaned with Hibiclens and saline.  Wound was already well approximated.  Dermabond was applied and wound care instructions were discussed with the patient.  Patient will be discharged home with head injury instructions.  He is to return to the emergency department for any symptoms of concern if he is unable to see his primary care provider right away.   ___________________________________________   FINAL CLINICAL IMPRESSION(S) / ED DIAGNOSES  Final diagnoses:  Minor head injury, initial encounter  Laceration of scalp, initial encounter     ED Discharge Orders    None       Troy Preston was evaluated in Emergency Department on 07/20/2020 for the symptoms described in the history of present illness. He was evaluated in the context of the global COVID-19 pandemic, which necessitated consideration that the patient might be at risk for infection with the SARS-CoV-2 virus that causes COVID-19. Institutional protocols and algorithms that pertain to the evaluation of patients at risk for COVID-19 are in a state of rapid change based on information released by regulatory bodies including the CDC and federal and state organizations. These policies and algorithms were followed during the patient's care in the ED.   Note:  This document was prepared using Dragon voice recognition software and may  include unintentional dictation errors.   Chinita Pester, FNP 07/20/20 2018    Merwyn Katos, MD 07/21/20 [phone]

## 2020-12-06 ENCOUNTER — Other Ambulatory Visit: Payer: Self-pay

## 2020-12-06 ENCOUNTER — Emergency Department
Admission: EM | Admit: 2020-12-06 | Discharge: 2020-12-06 | Disposition: A | Discharge status: Home / Self Care | Payer: Medicare HMO | Attending: Emergency Medicine | Admitting: Emergency Medicine

## 2020-12-06 ENCOUNTER — Emergency Department: Payer: Medicare HMO

## 2020-12-06 DIAGNOSIS — S0003XA Contusion of scalp, initial encounter: Secondary | ICD-10-CM | POA: Diagnosis not present

## 2020-12-06 DIAGNOSIS — S0101XA Laceration without foreign body of scalp, initial encounter: Secondary | ICD-10-CM | POA: Diagnosis not present

## 2020-12-06 DIAGNOSIS — R9431 Abnormal electrocardiogram [ECG] [EKG]: Secondary | ICD-10-CM | POA: Diagnosis not present

## 2020-12-06 DIAGNOSIS — S0990XA Unspecified injury of head, initial encounter: Secondary | ICD-10-CM

## 2020-12-06 DIAGNOSIS — W19XXXA Unspecified fall, initial encounter: Secondary | ICD-10-CM | POA: Diagnosis not present

## 2020-12-06 DIAGNOSIS — W108XXA Fall (on) (from) other stairs and steps, initial encounter: Secondary | ICD-10-CM | POA: Diagnosis not present

## 2020-12-06 DIAGNOSIS — E119 Type 2 diabetes mellitus without complications: Secondary | ICD-10-CM | POA: Diagnosis not present

## 2020-12-06 DIAGNOSIS — R58 Hemorrhage, not elsewhere classified: Secondary | ICD-10-CM | POA: Diagnosis not present

## 2020-12-06 DIAGNOSIS — Z87891 Personal history of nicotine dependence: Secondary | ICD-10-CM | POA: Insufficient documentation

## 2020-12-06 DIAGNOSIS — Z043 Encounter for examination and observation following other accident: Secondary | ICD-10-CM | POA: Diagnosis not present

## 2020-12-06 HISTORY — DX: Type 2 diabetes mellitus without complications: E11.9

## 2020-12-06 LAB — CBC WITH DIFFERENTIAL/PLATELET
Abs Immature Granulocytes: 0.05 10*3/uL (ref 0.00–0.07)
Basophils Absolute: 0 10*3/uL (ref 0.0–0.1)
Basophils Relative: 0 %
Eosinophils Absolute: 0 10*3/uL (ref 0.0–0.5)
Eosinophils Relative: 1 %
HCT: 38.1 % — ABNORMAL LOW (ref 39.0–52.0)
Hemoglobin: 13.2 g/dL (ref 13.0–17.0)
Immature Granulocytes: 1 %
Lymphocytes Relative: 11 %
Lymphs Abs: 0.9 10*3/uL (ref 0.7–4.0)
MCH: 32.1 pg (ref 26.0–34.0)
MCHC: 34.6 g/dL (ref 30.0–36.0)
MCV: 92.7 fL (ref 80.0–100.0)
Monocytes Absolute: 0.5 10*3/uL (ref 0.1–1.0)
Monocytes Relative: 7 %
Neutro Abs: 6.3 10*3/uL (ref 1.7–7.7)
Neutrophils Relative %: 80 %
Platelets: 145 10*3/uL — ABNORMAL LOW (ref 150–400)
RBC: 4.11 MIL/uL — ABNORMAL LOW (ref 4.22–5.81)
RDW: 12.1 % (ref 11.5–15.5)
WBC: 7.7 10*3/uL (ref 4.0–10.5)
nRBC: 0 % (ref 0.0–0.2)

## 2020-12-06 LAB — BASIC METABOLIC PANEL
Anion gap: 9 (ref 5–15)
BUN: 25 mg/dL — ABNORMAL HIGH (ref 8–23)
CO2: 22 mmol/L (ref 22–32)
Calcium: 9.3 mg/dL (ref 8.9–10.3)
Chloride: 107 mmol/L (ref 98–111)
Creatinine, Ser: 1.09 mg/dL (ref 0.61–1.24)
GFR, Estimated: >60 mL/min (ref >60)
Glucose, Bld: 220 mg/dL — ABNORMAL HIGH (ref 70–99)
Potassium: 3.9 mmol/L (ref 3.5–5.1)
Sodium: 138 mmol/L (ref 135–145)

## 2020-12-06 LAB — TROPONIN I (HIGH SENSITIVITY): Troponin I (High Sensitivity): 4 ng/L (ref <18)

## 2020-12-06 NOTE — ED Triage Notes (Signed)
Per EMS: pt fell backwards down two stairs and hit back of head, no LOC, vitals WNL, BGL 200. C-collar in place. Pt denies any other pain or injury. Laceration and hematoma noted to back scalp., bleeding is controlled with gauze dressing. Pt is AOX4, NAD noted.

## 2020-12-06 NOTE — ED Provider Notes (Signed)
[facility] Emergency Department Provider Note ____________________________________________   Event Date/Time   First MD Initiated Contact with Patient 12/06/20 1423     (approximate)  I have reviewed the triage vital signs and the nursing notes.   HISTORY  Chief Complaint Fall  HPI Tarri FullerWayne A Allaire is a 87 y.o. male with history of diabetes presents to the emergency department for treatment and evaluation after sustaining a mechanical, nonsyncopal fall prior to arrival.  Patient states that his legs got weak as he was trying to get out of his car and he fell backward and hit his head on the concrete.  He denies loss of consciousness.  He has a laceration to the right scalp.  He denies pain in the neck, back, extremities.      Past Medical History:  Diagnosis Date  . Constipation   . Constipation   . Diabetes mellitus without complication (HCC)   . Esophagitis   . Esophagitis   . Impaired glucose tolerance   . Impaired glucose tolerance   . Tinnitus   . Tinnitus   . Urinary frequency   . Urinary frequency     Patient Active Problem List   Diagnosis Date Noted  . Hypogonadism in male 12/28/2014  . 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 Gallstone 12/28/2014  . Blood glucose elevated 09/20/2014  . External hemorrhoid 09/20/2014  . CN (constipation) 05/15/2014  . Esophagitis 05/15/2014  . Borderline diabetes 05/15/2014  . Buzzing in ear 05/15/2014    Past Surgical History:  Procedure Laterality Date  . Palmer fascietomy and release of Dupuytren's contracture     right little finger, left ring and little fingers    Prior to Admission medications   Medication Sig Start Date End Date Taking? Authorizing Provider  acetaminophen (TYLENOL) 325 MG tablet Take 650 mg by mouth every 6 (six) hours as needed.    [provider]  bisacodyl (STIMULANT LAXATIVE) 5 MG EC tablet Take 5 mg by mouth.    [provider]  Cinnamon 500 MG capsule Take 500 mg by mouth.    [provider]  cyanocobalamin (V-R VITAMIN B-12) 500 MCG tablet Take by mouth.    [provider]  ibuprofen (ADVIL,MOTRIN) 100 MG tablet Take 100 mg by mouth every 6 (six) hours as needed for fever.    [provider]  LORazepam (ATIVAN) 1 MG tablet take 1 pill 60 min before MRI and 2nd pill 15 min before MRI if needed. Patient should not drive when under influence of medicine. 11/06/14   [provider]  omeprazole (PRILOSEC) 20 MG capsule Take 20 mg by mouth.    [provider]  Testosterone (ANDROGEL) 20.25 MG/1.25GM (1.62%) GEL Apply 20 each topically daily. Patient needs to apply 2 pumps daily 12/26/14   Michiel CowboyMcGowan, Shannon A, PA-C    Allergies Patient has no known allergies.  Family History  Problem Relation Age of Onset  . Cancer Other   . Diabetes Mellitus II Mother   . Heart attack Father     Social History Social History   Tobacco Use  . Smoking status: Former Games developermoker  . Smokeless tobacco: Never Used  Substance Use Topics  . Alcohol use: No    Alcohol/week: 0.0 standard drinks    Review of Systems  Constitutional: No fever/chills Eyes: No visual changes. ENT: No sore throat. Cardiovascular: Denies chest pain. Respiratory: Denies shortness of breath. Gastrointestinal: No abdominal pain.  No nausea, no  vomiting.  No diarrhea.  No constipation. Genitourinary: Negative for dysuria. Musculoskeletal: Negative for neck, back, or extremity pain Skin: Positive for hematoma and laceration to the scalp Neurological: Negative for headaches, focal weakness or numbness. ____________________________________________   PHYSICAL EXAM:  VITAL SIGNS: ED Triage Vitals  Enc Vitals Group     BP 12/06/20 1427 137/70     Pulse Rate 12/06/20 1427 72     Resp 12/06/20 1427 19     Temp 12/06/20 1427 97.6 F (36.4 C)     Temp Source 12/06/20 1427 Oral     SpO2 12/06/20 1427 99 %      Weight 12/06/20 1428 168 lb 3.4 oz (76.3 kg)     Height 12/06/20 1428 5\' 8"  (1.727 m)     Head Circumference --      Peak Flow --      Pain Score 12/06/20 1428 1     Pain Loc --      Pain Edu? --      Excl. in [place]? --     Constitutional: Alert and oriented. Well appearing and in no acute distress. Eyes: Conjunctivae are normal. PERRL. EOMI. Head: Scalp hematoma and laceration noted. Nose: No congestion/rhinnorhea. Mouth/Throat: Mucous membranes are moist.  Oropharynx non-erythematous. Neck: No stridor.   Hematological/Lymphatic/Immunilogical: No cervical lymphadenopathy. Cardiovascular: Normal rate, regular rhythm. Grossly normal heart sounds.  Good peripheral circulation. Respiratory: Normal respiratory effort.  No retractions. Lungs CTAB. Gastrointestinal: Soft and nontender. No distention. No abdominal bruits. No CVA tenderness. Genitourinary:  Musculoskeletal: No lower extremity tenderness nor edema.  No joint effusions. Neurologic:  Normal speech and language. No gross focal neurologic deficits are appreciated. No gait instability. Skin: Hematoma noted over the right parietal aspect of the scalp with a stellate laceration. Psychiatric: Mood and affect are normal. Speech and behavior are normal.  ____________________________________________   LABS (all labs ordered are listed, but only abnormal results are displayed)  Labs Reviewed  BASIC METABOLIC PANEL - Abnormal; Notable for the following components:      Result Value   Glucose, Bld 220 (*)    BUN 25 (*)    All other components within normal limits  CBC WITH DIFFERENTIAL/PLATELET - Abnormal; Notable for the following components:   RBC 4.11 (*)    HCT 38.1 (*)    Platelets 145 (*)    All other components within normal limits  URINALYSIS, COMPLETE (UACMP) WITH MICROSCOPIC  TROPONIN I (HIGH SENSITIVITY)   ____________________________________________  EKG  ED ECG REPORT I, Zebulan Hinshaw, FNP-BC personally viewed  and interpreted this ECG.   Date: 12/06/2020  EKG Time: 1426  Rate: 71  Rhythm: normal sinus rhythm  Axis: normal  Intervals:right bundle branch block and left anterior fascicular block  ST&T Change: no ectopy  ____________________________________________  RADIOLOGY  ED MD interpretation:    CT of the head and cervical spine are negative for acute findings per radiology.  I, 12/08/2020, personally viewed and evaluated these images (plain radiographs) as part of my medical decision making, as well as reviewing the written report by the radiologist.  Official radiology report(s): CT Head Wo Contrast  Result Date: 12/06/2020 CLINICAL DATA:  Head injury after fall. EXAM: CT HEAD WITHOUT CONTRAST CT CERVICAL SPINE WITHOUT CONTRAST TECHNIQUE: Multidetector CT imaging of the head and cervical spine was performed following the standard protocol without intravenous contrast. Multiplanar CT image reconstructions of the cervical spine were also generated. COMPARISON:  July 20, 2020. FINDINGS: CT HEAD FINDINGS Brain: Mild diffuse  cortical atrophy is noted. No mass effect or midline shift is noted. Ventricular size is within normal limits. There is no evidence of mass lesion, hemorrhage or acute infarction. Vascular: No hyperdense vessel or unexpected calcification. Skull: Normal. Negative for fracture or focal lesion. Sinuses/Orbits: No acute finding. Other: Large right posterior scalp hematoma is noted. CT CERVICAL SPINE FINDINGS Alignment: Normal. Skull base and vertebrae: No acute fracture. No primary bone lesion or focal pathologic process. Soft tissues and spinal canal: No prevertebral fluid or swelling. No visible canal hematoma. Disc levels:  Normal. Upper chest: Negative. Other: None. IMPRESSION: No acute intracranial abnormality seen. Large right posterior scalp hematoma is noted. No significant abnormality seen in the cervical spine. Electronically Signed   By: Lupita Raider M.D.   On:  12/06/2020 16:43   CT Cervical Spine Wo Contrast  Result Date: 12/06/2020 CLINICAL DATA:  Head injury after fall. EXAM: CT HEAD WITHOUT CONTRAST CT CERVICAL SPINE WITHOUT CONTRAST TECHNIQUE: Multidetector CT imaging of the head and cervical spine was performed following the standard protocol without intravenous contrast. Multiplanar CT image reconstructions of the cervical spine were also generated. COMPARISON:  July 20, 2020. FINDINGS: CT HEAD FINDINGS Brain: Mild diffuse cortical atrophy is noted. No mass effect or midline shift is noted. Ventricular size is within normal limits. There is no evidence of mass lesion, hemorrhage or acute infarction. Vascular: No hyperdense vessel or unexpected calcification. Skull: Normal. Negative for fracture or focal lesion. Sinuses/Orbits: No acute finding. Other: Large right posterior scalp hematoma is noted. CT CERVICAL SPINE FINDINGS Alignment: Normal. Skull base and vertebrae: No acute fracture. No primary bone lesion or focal pathologic process. Soft tissues and spinal canal: No prevertebral fluid or swelling. No visible canal hematoma. Disc levels:  Normal. Upper chest: Negative. Other: None. IMPRESSION: No acute intracranial abnormality seen. Large right posterior scalp hematoma is noted. No significant abnormality seen in the cervical spine. Electronically Signed   By: Lupita Raider M.D.   On: 12/06/2020 16:43    ____________________________________________   PROCEDURES  Procedure(s) performed (including Critical Care):  Marland KitchenMarland KitchenLaceration Repair  Date/Time: 12/06/2020 8:18 PM Performed by: Chinita Pester, FNP Authorized by: Chinita Pester, FNP   Consent:    Consent obtained:  Verbal   Consent given by:  Patient   Risks discussed:  Poor wound healing and poor cosmetic result Anesthesia:    Anesthesia method:  None Laceration details:    Location:  Scalp   Scalp location:  R parietal   Length (cm):  3 Pre-procedure details:    Preparation:   Imaging obtained to evaluate for foreign bodies Treatment:    Area cleansed with:  Chlorhexidine and saline   Irrigation method:  Tap Skin repair:    Repair method:  Staples   Number of staples:  3 Approximation:    Approximation:  Close Repair type:    Repair type:  Simple Post-procedure details:    Dressing:  Bulky dressing   Procedure completion:  Tolerated well, no immediate complications    ____________________________________________   INITIAL IMPRESSION / ASSESSMENT AND PLAN   87 year old male presenting to the emergency department for treatment and evaluation after mechanical, nonsyncopal fall at home prior to arrival.  See HPI for further details.  Plan will be to get images of his head and cervical spine then repair the laceration to the scalp.  DIFFERENTIAL DIAGNOSIS  Concussion, intracranial hemorrhage, cervical spine injury  ED COURSE  CT of the head and cervical spine are reassuring  as are labs and EKG.  Wound repaired as above.  Patient feels comfortable going home.  Head injury instructions will be provided.  He is to see his primary care provider for any additional symptoms or concerns or return to the emergency department.  He is to be seen there in 7 days for staple removal.    ___________________________________________   FINAL CLINICAL IMPRESSION(S) / ED DIAGNOSES  Final diagnoses:  Injury of head, initial encounter  Laceration of occipital scalp, initial encounter     ED Discharge Orders    None       REXTON GREULICH was evaluated in Emergency Department on 12/06/2020 for the symptoms described in the history of present illness. He was evaluated in the context of the global COVID-19 pandemic, which necessitated consideration that the patient might be at risk for infection with the SARS-CoV-2 virus that causes COVID-19. Institutional protocols and algorithms that pertain to the evaluation of patients at risk for COVID-19 are in a state of rapid  change based on information released by regulatory bodies including the CDC and federal and state organizations. These policies and algorithms were followed during the patient's care in the ED.   Note:  This document was prepared using Dragon voice recognition software and may include unintentional dictation errors.   Chinita Pester, FNP 12/06/20 2021    Loleta Rose, MD 12/06/20 2354

## 2020-12-06 NOTE — ED Notes (Signed)
Head re-wrapped with nonadhering pad and gauze wrap.

## 2020-12-06 NOTE — ED Notes (Signed)
Pt's hands cleaned of dried blood. Pt given urinal, instructed not to stand up at this time and educated on call bell. Pt verbalizes understanding.

## 2020-12-06 NOTE — Discharge Instructions (Signed)
Please have your primary care provider remove the staples in approximately 7 days.  Use a cane or walker to help with stability.  Return to the emergency department for symptoms of change or worsen or for new concerns if you are unable to schedule an appointment with primary care.

## 2020-12-06 NOTE — ED Notes (Signed)
Troy Preston daughter updated- number is [phone]. Troy Preston son- [phone], call once discharged for ride home.

## 2020-12-24 DIAGNOSIS — K644 Residual hemorrhoidal skin tags: Secondary | ICD-10-CM | POA: Diagnosis not present

## 2020-12-24 DIAGNOSIS — Z79899 Other long term (current) drug therapy: Secondary | ICD-10-CM | POA: Diagnosis not present

## 2020-12-24 DIAGNOSIS — R29898 Other symptoms and signs involving the musculoskeletal system: Secondary | ICD-10-CM | POA: Diagnosis not present

## 2020-12-24 DIAGNOSIS — K219 Gastro-esophageal reflux disease without esophagitis: Secondary | ICD-10-CM | POA: Diagnosis not present

## 2020-12-24 DIAGNOSIS — D649 Anemia, unspecified: Secondary | ICD-10-CM | POA: Diagnosis not present

## 2020-12-24 DIAGNOSIS — E1165 Type 2 diabetes mellitus with hyperglycemia: Secondary | ICD-10-CM | POA: Diagnosis not present

## 2020-12-24 DIAGNOSIS — Z125 Encounter for screening for malignant neoplasm of prostate: Secondary | ICD-10-CM | POA: Diagnosis not present

## 2020-12-31 DIAGNOSIS — R29898 Other symptoms and signs involving the musculoskeletal system: Secondary | ICD-10-CM | POA: Diagnosis not present

## 2020-12-31 DIAGNOSIS — Z0000 Encounter for general adult medical examination without abnormal findings: Secondary | ICD-10-CM | POA: Diagnosis not present

## 2020-12-31 DIAGNOSIS — R296 Repeated falls: Secondary | ICD-10-CM | POA: Diagnosis not present

## 2020-12-31 DIAGNOSIS — D649 Anemia, unspecified: Secondary | ICD-10-CM | POA: Diagnosis not present

## 2020-12-31 DIAGNOSIS — R2681 Unsteadiness on feet: Secondary | ICD-10-CM | POA: Diagnosis not present

## 2020-12-31 DIAGNOSIS — E119 Type 2 diabetes mellitus without complications: Secondary | ICD-10-CM | POA: Diagnosis not present

## 2020-12-31 DIAGNOSIS — F17210 Nicotine dependence, cigarettes, uncomplicated: Secondary | ICD-10-CM | POA: Diagnosis not present

## 2021-06-17 DIAGNOSIS — H524 Presbyopia: Secondary | ICD-10-CM | POA: Diagnosis not present

## 2021-06-17 DIAGNOSIS — E119 Type 2 diabetes mellitus without complications: Secondary | ICD-10-CM | POA: Diagnosis not present

## 2021-06-25 DIAGNOSIS — D649 Anemia, unspecified: Secondary | ICD-10-CM | POA: Diagnosis not present

## 2021-06-25 DIAGNOSIS — R296 Repeated falls: Secondary | ICD-10-CM | POA: Diagnosis not present

## 2021-06-25 DIAGNOSIS — E1165 Type 2 diabetes mellitus with hyperglycemia: Secondary | ICD-10-CM | POA: Diagnosis not present

## 2021-06-25 DIAGNOSIS — R29898 Other symptoms and signs involving the musculoskeletal system: Secondary | ICD-10-CM | POA: Diagnosis not present

## 2021-06-25 DIAGNOSIS — H2511 Age-related nuclear cataract, right eye: Secondary | ICD-10-CM | POA: Diagnosis not present

## 2021-06-25 DIAGNOSIS — R2681 Unsteadiness on feet: Secondary | ICD-10-CM | POA: Diagnosis not present

## 2021-06-25 DIAGNOSIS — H2512 Age-related nuclear cataract, left eye: Secondary | ICD-10-CM | POA: Diagnosis not present

## 2021-06-30 ENCOUNTER — Encounter: Payer: Self-pay | Admitting: Ophthalmology

## 2021-07-02 DIAGNOSIS — R2681 Unsteadiness on feet: Secondary | ICD-10-CM | POA: Diagnosis not present

## 2021-07-02 DIAGNOSIS — R262 Difficulty in walking, not elsewhere classified: Secondary | ICD-10-CM | POA: Diagnosis not present

## 2021-07-02 DIAGNOSIS — D649 Anemia, unspecified: Secondary | ICD-10-CM | POA: Diagnosis not present

## 2021-07-02 DIAGNOSIS — R296 Repeated falls: Secondary | ICD-10-CM | POA: Diagnosis not present

## 2021-07-02 DIAGNOSIS — R29898 Other symptoms and signs involving the musculoskeletal system: Secondary | ICD-10-CM | POA: Diagnosis not present

## 2021-07-02 DIAGNOSIS — K5900 Constipation, unspecified: Secondary | ICD-10-CM | POA: Diagnosis not present

## 2021-07-02 DIAGNOSIS — E119 Type 2 diabetes mellitus without complications: Secondary | ICD-10-CM | POA: Diagnosis not present

## 2021-07-02 NOTE — Discharge Instructions (Signed)

## 2021-07-07 ENCOUNTER — Other Ambulatory Visit: Payer: Self-pay

## 2021-07-07 ENCOUNTER — Ambulatory Visit: Payer: Medicare HMO | Admitting: Anesthesiology

## 2021-07-07 ENCOUNTER — Encounter: Payer: Self-pay | Admitting: Ophthalmology

## 2021-07-07 ENCOUNTER — Encounter
Admission: RE | Disposition: A | Discharge status: Home / Self Care | Payer: Self-pay | Source: Home / Self Care | Attending: Ophthalmology

## 2021-07-07 ENCOUNTER — Ambulatory Visit
Admission: RE | Admit: 2021-07-07 | Discharge: 2021-07-07 | Disposition: A | Discharge status: Home / Self Care | Payer: Medicare HMO | Attending: Ophthalmology | Admitting: Ophthalmology

## 2021-07-07 DIAGNOSIS — Z87891 Personal history of nicotine dependence: Secondary | ICD-10-CM | POA: Insufficient documentation

## 2021-07-07 DIAGNOSIS — E1136 Type 2 diabetes mellitus with diabetic cataract: Secondary | ICD-10-CM | POA: Diagnosis not present

## 2021-07-07 DIAGNOSIS — H2511 Age-related nuclear cataract, right eye: Secondary | ICD-10-CM | POA: Diagnosis not present

## 2021-07-07 DIAGNOSIS — H25811 Combined forms of age-related cataract, right eye: Secondary | ICD-10-CM | POA: Diagnosis not present

## 2021-07-07 HISTORY — PX: CATARACT EXTRACTION W/PHACO: SHX586

## 2021-07-07 HISTORY — DX: Presence of external hearing-aid: Z97.4

## 2021-07-07 HISTORY — DX: Unspecified hearing loss, unspecified ear: H91.90

## 2021-07-07 LAB — GLUCOSE, CAPILLARY
Glucose-Capillary: 154 mg/dL — ABNORMAL HIGH (ref 70–99)
Glucose-Capillary: 157 mg/dL — ABNORMAL HIGH (ref 70–99)

## 2021-07-07 SURGERY — PHACOEMULSIFICATION, CATARACT, WITH IOL INSERTION
Anesthesia: Monitor Anesthesia Care | Site: Eye | Laterality: Right

## 2021-07-07 MED ORDER — MIDAZOLAM HCL 2 MG/2ML IJ SOLN
INTRAMUSCULAR | Status: DC | PRN
  Administered 2021-07-07: .5 mg via INTRAVENOUS

## 2021-07-07 MED ORDER — MOXIFLOXACIN HCL 0.5 % OP SOLN
OPHTHALMIC | Status: DC | PRN
  Administered 2021-07-07: 0.2 mL via OPHTHALMIC

## 2021-07-07 MED ORDER — ARMC OPHTHALMIC DILATING DROPS
1.0000 "application " | OPHTHALMIC | Status: DC | PRN
  Administered 2021-07-07 (×3): 1 via OPHTHALMIC

## 2021-07-07 MED ORDER — SIGHTPATH DOSE#1 BSS IO SOLN
INTRAOCULAR | Status: DC | PRN
  Administered 2021-07-07: 15 mL

## 2021-07-07 MED ORDER — SIGHTPATH DOSE#1 BSS IO SOLN
INTRAOCULAR | Status: DC | PRN
  Administered 2021-07-07: 1 mL via INTRAMUSCULAR

## 2021-07-07 MED ORDER — SIGHTPATH DOSE#1 NA CHONDROIT SULF-NA HYALURON 40-17 MG/ML IO SOLN
INTRAOCULAR | Status: DC | PRN
  Administered 2021-07-07: 1 mL via INTRAOCULAR

## 2021-07-07 MED ORDER — LACTATED RINGERS IV SOLN: INTRAVENOUS | Status: DC

## 2021-07-07 MED ORDER — SIGHTPATH DOSE#1 BSS IO SOLN
INTRAOCULAR | Status: DC | PRN
  Administered 2021-07-07: 09:00:00 70 mL via OPHTHALMIC

## 2021-07-07 MED ORDER — BRIMONIDINE TARTRATE-TIMOLOL 0.2-0.5 % OP SOLN
OPHTHALMIC | Status: DC | PRN
  Administered 2021-07-07: 1 [drp] via OPHTHALMIC

## 2021-07-07 MED ORDER — FENTANYL CITRATE (PF) 100 MCG/2ML IJ SOLN
INTRAMUSCULAR | Status: DC | PRN
  Administered 2021-07-07: 50 ug via INTRAVENOUS

## 2021-07-07 MED ORDER — TETRACAINE HCL 0.5 % OP SOLN
1.0000 [drp] | OPHTHALMIC | Status: DC | PRN
  Administered 2021-07-07 (×3): 1 [drp] via OPHTHALMIC

## 2021-07-07 SURGICAL SUPPLY — 14 items
CANNULA ANT/CHMB 27G (MISCELLANEOUS) IMPLANT
CANNULA ANT/CHMB 27GA (MISCELLANEOUS) IMPLANT
GLOVE SURG ENC TEXT LTX SZ8 (GLOVE) ×3 IMPLANT
GLOVE SURG TRIUMPH 8.0 PF LTX (GLOVE) ×3 IMPLANT
LENS IOL TECNIS EYHANCE 22.0 (Intraocular Lens) ×2 IMPLANT
NDL FILTER BLUNT 18X1 1/2 (NEEDLE) ×1 IMPLANT
NEEDLE FILTER BLUNT 18X 1/2SAF (NEEDLE) ×2
NEEDLE FILTER BLUNT 18X1 1/2 (NEEDLE) ×1 IMPLANT
PACK VIT ANT 23G (MISCELLANEOUS) IMPLANT
RING MALYGIN (MISCELLANEOUS) IMPLANT
SUT ETHILON 10-0 CS-B-6CS-B-6 (SUTURE)
SUTURE EHLN 10-0 CS-B-6CS-B-6 (SUTURE) IMPLANT
SYR 3ML LL SCALE MARK (SYRINGE) ×3 IMPLANT
WATER STERILE IRR 250ML POUR (IV SOLUTION) ×3 IMPLANT

## 2021-07-07 NOTE — Anesthesia Postprocedure Evaluation (Signed)
Anesthesia Post Note  Patient: Troy Preston  Procedure(s) Performed: CATARACT EXTRACTION PHACO AND INTRAOCULAR LENS PLACEMENT (IOC) RIGHT DIABETIC 24.31 01:55.6 (Right: Eye)     Patient location during evaluation: PACU Anesthesia Type: MAC Level of consciousness: awake and alert Pain management: pain level controlled Vital Signs Assessment: post-procedure vital signs reviewed and stable Respiratory status: nonlabored ventilation and spontaneous breathing Cardiovascular status: blood pressure returned to baseline Postop Assessment: no apparent nausea or vomiting Anesthetic complications: no   No notable events documented.  Skila Rollins [employer]

## 2021-07-07 NOTE — Anesthesia Preprocedure Evaluation (Signed)
Anesthesia Evaluation  Patient identified by MRN, date of birth, ID band Patient awake    Reviewed: Allergy & Precautions, NPO status , Patient's Chart, lab work & pertinent test results  Airway Mallampati: II  TM Distance: >3 FB Neck ROM: Full    Dental no notable dental hx.    Pulmonary former smoker,    Pulmonary exam normal        Cardiovascular hypertension, Normal cardiovascular exam     Neuro/Psych    GI/Hepatic GERD  Medicated and Controlled,  Endo/Other  diabetes, Type 2  Renal/GU    BPH    Musculoskeletal negative musculoskeletal ROS (+)   Abdominal Normal abdominal exam  (+)   Peds  Hematology negative hematology ROS (+)   Anesthesia Other Findings cataracts  Reproductive/Obstetrics                             Anesthesia Physical Anesthesia Plan  ASA: 2  Anesthesia Plan: MAC   Post-op Pain Management: Minimal or no pain anticipated   Induction: Intravenous  PONV Risk Score and Plan: 1 and Midazolam, TIVA and Treatment may vary due to age or medical condition  Airway Management Planned: Natural Airway and Nasal Cannula  Additional Equipment:   Intra-op Plan:   Post-operative Plan:   Informed Consent: I have reviewed the patients History and Physical, chart, labs and discussed the procedure including the risks, benefits and alternatives for the proposed anesthesia with the patient or authorized representative who has indicated his/her understanding and acceptance.     Dental advisory given  Plan Discussed with: CRNA  Anesthesia Plan Comments:         Anesthesia Quick Evaluation

## 2021-07-07 NOTE — Op Note (Signed)
PREOPERATIVE DIAGNOSIS:  Nuclear sclerotic cataract of the right eye.   POSTOPERATIVE DIAGNOSIS:  H25.11 Cataract   OPERATIVE PROCEDURE:ORPROCALL@   SURGEON:  Galen Manila, MD.   ANESTHESIA:  Anesthesiologist: Fletcher Anon, MD CRNA: Michaele Offer, CRNA  1.      Managed anesthesia care. 2.      0.59ml of Shugarcaine was instilled in the eye following the paracentesis.   COMPLICATIONS:  None.   TECHNIQUE:   Stop and chop   DESCRIPTION OF PROCEDURE:  The patient was examined and consented in the preoperative holding area where the aforementioned topical anesthesia was applied to the right eye and then brought back to the Operating Room where the right eye was prepped and draped in the usual sterile ophthalmic fashion and a lid speculum was placed. A paracentesis was created with the side port blade and the anterior chamber was filled with viscoelastic. A near clear corneal incision was performed with the steel keratome. A continuous curvilinear capsulorrhexis was performed with a cystotome followed by the capsulorrhexis forceps. Hydrodissection and hydrodelineation were carried out with BSS on a blunt cannula. The lens was removed in a stop and chop  technique and the remaining cortical material was removed with the irrigation-aspiration handpiece. The capsular bag was inflated with viscoelastic and the Technis ZCB00  lens was placed in the capsular bag without complication. The remaining viscoelastic was removed from the eye with the irrigation-aspiration handpiece. The wounds were hydrated. The anterior chamber was flushed with BSS and the eye was inflated to physiologic pressure. 0.86ml of Vigamox was placed in the anterior chamber. The wounds were found to be water tight. The eye was dressed with Combigan. The patient was given protective glasses to wear throughout the day and a shield with which to sleep tonight. The patient was also given drops with which to begin a drop regimen today and  will follow-up with me in one day. Implant Name Type Inv. Item Serial No. Manufacturer Lot No. LRB No. Used Action  LENS IOL TECNIS EYHANCE 22.0 - [id number] Intraocular Lens LENS IOL TECNIS EYHANCE 22.0 [id number]   Right 1 Implanted   Procedure(s) with comments: CATARACT EXTRACTION PHACO AND INTRAOCULAR LENS PLACEMENT (IOC) RIGHT DIABETIC 24.31 01:55.6 (Right) - Diabetic  Electronically signed: Galen Manila 07/07/2021 8:41 AM

## 2021-07-07 NOTE — Transfer of Care (Signed)
Immediate Anesthesia Transfer of Care Note  Patient: Troy Preston  Procedure(s) Performed: CATARACT EXTRACTION PHACO AND INTRAOCULAR LENS PLACEMENT (IOC) RIGHT DIABETIC 24.31 01:55.6 (Right: Eye)  Patient Location: PACU  Anesthesia Type: MAC  Level of Consciousness: awake, alert  and patient cooperative  Airway and Oxygen Therapy: Patient Spontanous Breathing and Patient connected to supplemental oxygen  Post-op Assessment: Post-op Vital signs reviewed, Patient's Cardiovascular Status Stable, Respiratory Function Stable, Patent Airway and No signs of Nausea or vomiting  Post-op Vital Signs: Reviewed and stable  Complications: No notable events documented.

## 2021-07-07 NOTE — H&P (Signed)
[facility]   Primary Care Physician:  Barbette Reichmann, MD Ophthalmologist: Dr. Druscilla Brownie  Pre-Procedure History & Physical: HPI:  Troy Preston is a 87 y.o. male here for cataract surgery.   Past Medical History:  Diagnosis Date   Constipation    Diabetes mellitus without complication (HCC)    Esophagitis    HOH (hard of hearing)    Impaired glucose tolerance    Tinnitus    Urinary frequency    Wears hearing aid in both ears     Past Surgical History:  Procedure Laterality Date   Palmer fascietomy and release of Dupuytren's contracture     right little finger, left ring and little fingers    Prior to Admission medications   Medication Sig Start Date End Date Taking? Authorizing Provider  acetaminophen (TYLENOL) 325 MG tablet Take 650 mg by mouth every 6 (six) hours as needed.   Yes [provider]  ASPIRIN 81 PO Take by mouth daily.   Yes [provider]  Cinnamon 500 MG capsule Take 500 mg by mouth.   Yes [provider]  ibuprofen (ADVIL,MOTRIN) 100 MG tablet Take 100 mg by mouth every 6 (six) hours as needed for fever.   Yes [provider]  metFORMIN (GLUCOPHAGE) 1000 MG tablet Take 1,000 mg by mouth daily with breakfast.   Yes [provider]  omeprazole (PRILOSEC) 20 MG capsule Take 20 mg by mouth.   Yes [provider]  Testosterone (ANDROGEL) 20.25 MG/1.25GM (1.62%) GEL Apply 20 each topically daily. Patient needs to apply 2 pumps daily 12/26/14  Yes McGowan, Carollee Herter A, PA-C  vitamin B-12 (CYANOCOBALAMIN) 500 MCG tablet Take by mouth.   Yes [provider]  bisacodyl (STIMULANT LAXATIVE) 5 MG EC tablet Take 5 mg by mouth.    [provider]  LORazepam (ATIVAN) 1 MG tablet take 1 pill 60 min before MRI and 2nd pill 15 min before MRI if needed. Patient should not drive when under influence of medicine. Patient not taking: Reported on 06/30/2021 11/06/14   [provider]     Allergies as of 06/19/2021   (No Known Allergies)    Family History  Problem Relation Age of Onset   Cancer Other    Diabetes Mellitus II Mother    Heart attack Father     Social History   Socioeconomic History   Marital status: Widowed    Spouse name: Not on file   Number of children: Not on file   Years of education: Not on file   Highest education level: Not on file  Occupational History   Not on file  Tobacco Use   Smoking status: Former    Types: Cigarettes   Smokeless tobacco: Current    Types: Chew   Tobacco comments:    Quit smoking "over 20 years ago"  Vaping Use   Vaping Use: Never used  Substance and Sexual Activity   Alcohol use: No    Alcohol/week: 0.0 standard drinks   Drug use: Not on file   Sexual activity: Not on file  Other Topics Concern   Not on file  Social History Narrative   Not on file   Social Determinants of Health   Financial Resource Strain: Not on file  Food Insecurity: Not on file  Transportation Needs: Not on file  Physical Activity: Not on file  Stress: Not on file  Social Connections: Not on file  Intimate Partner Violence: Not on file  Review of Systems: See HPI, otherwise negative ROS  Physical Exam: BP (!) 164/81    Pulse (!) 57    Temp 97.7 F (36.5 C) (Temporal)    Resp 18    Ht 5\' 8"  (1.727 m)    Wt 73 kg    SpO2 100%    BMI 24.48 kg/m  General:   Alert, cooperative in NAD Head:  Normocephalic and atraumatic. Respiratory:  Normal work of breathing. Cardiovascular:  RRR  Impression/Plan: is here for cataract surgery.  Risks, benefits, limitations, and alternatives regarding cataract surgery have been reviewed with the patient.  Questions have been answered.  All parties agreeable.   [name], MD  07/07/2021, 8:17 AM

## 2021-07-08 ENCOUNTER — Encounter: Payer: Self-pay | Admitting: Ophthalmology

## 2021-07-23 ENCOUNTER — Other Ambulatory Visit: Payer: Self-pay

## 2021-07-23 ENCOUNTER — Emergency Department
Admission: EM | Admit: 2021-07-23 | Discharge: 2021-07-23 | Disposition: A | Discharge status: Home / Self Care | Payer: Medicare HMO | Attending: Emergency Medicine | Admitting: Emergency Medicine

## 2021-07-23 ENCOUNTER — Emergency Department: Payer: Medicare HMO

## 2021-07-23 ENCOUNTER — Encounter: Payer: Self-pay | Admitting: Emergency Medicine

## 2021-07-23 DIAGNOSIS — M47812 Spondylosis without myelopathy or radiculopathy, cervical region: Secondary | ICD-10-CM | POA: Diagnosis not present

## 2021-07-23 DIAGNOSIS — W01198A Fall on same level from slipping, tripping and stumbling with subsequent striking against other object, initial encounter: Secondary | ICD-10-CM | POA: Diagnosis not present

## 2021-07-23 DIAGNOSIS — S0101XA Laceration without foreign body of scalp, initial encounter: Secondary | ICD-10-CM | POA: Insufficient documentation

## 2021-07-23 DIAGNOSIS — S0990XA Unspecified injury of head, initial encounter: Secondary | ICD-10-CM

## 2021-07-23 DIAGNOSIS — Z23 Encounter for immunization: Secondary | ICD-10-CM | POA: Diagnosis not present

## 2021-07-23 DIAGNOSIS — S0003XA Contusion of scalp, initial encounter: Secondary | ICD-10-CM | POA: Diagnosis not present

## 2021-07-23 DIAGNOSIS — Z043 Encounter for examination and observation following other accident: Secondary | ICD-10-CM | POA: Diagnosis not present

## 2021-07-23 DIAGNOSIS — R739 Hyperglycemia, unspecified: Secondary | ICD-10-CM | POA: Diagnosis not present

## 2021-07-23 MED ORDER — TETANUS-DIPHTH-ACELL PERTUSSIS 5-2.5-18.5 LF-MCG/0.5 IM SUSY: 0.5000 mL | PREFILLED_SYRINGE | Freq: Once | INTRAMUSCULAR | Status: DC

## 2021-07-23 MED ORDER — LIDOCAINE-EPINEPHRINE-TETRACAINE (LET) TOPICAL GEL: 3.0000 mL | Freq: Once | TOPICAL | Status: DC

## 2021-07-23 NOTE — ED Provider Triage Note (Signed)
Emergency Medicine Provider Triage Evaluation Note  Troy Preston , a 88 y.o. male  was evaluated in triage.  Pt complains of head injury, fell and hit the back of his head.  Review of Systems  Positive: Injury, laceration scalp Negative: LOC, nausea/vomiting  Physical Exam  BP (!) 142/67    Pulse 69    Temp 98.1 F (36.7 C) (Oral)    Resp 14    Ht 5\' 8"  (1.727 m)    Wt 73 kg    SpO2 97%    BMI 24.48 kg/m  Gen:   Awake, no distress   Resp:  Normal effort  MSK:   Moves extremities without difficulty  Other:    Medical Decision Making  Medically screening exam initiated at 1:07 PM.  Appropriate orders placed.  [name] was informed that the remainder of the evaluation will be completed by another provider, this initial triage assessment does not replace that evaluation, and the importance of remaining in the ED until their evaluation is complete.     [name], PA-C 07/23/21 1307

## 2021-07-23 NOTE — ED Provider Notes (Signed)
[facility] Provider Note    Event Date/Time   First MD Initiated Contact with Patient 07/23/21 1409     (approximate)   History   Fall and Laceration   HPI  Troy Preston is a 88 y.o. male who presents after a fall.  Patient reports he uses a walker to ambulate, lost his balance fell backwards hit his head on hardwood floor.  Complains primarily of laceration to the scalp, denies head pain or neck pain at this time although did have some initially.  Denies other injuries.  No extremity injuries.  No back pain     Physical Exam   Triage Vital Signs: ED Triage Vitals [07/23/21 1302]  Enc Vitals Group     BP (!) 142/67     Pulse Rate 69     Resp 14     Temp 98.1 F (36.7 C)     Temp Source Oral     SpO2 97 %     Weight 73 kg (160 lb 15.7 oz)     Height 1.727 m (5\' 8" )     Head Circumference      Peak Flow      Pain Score 0     Pain Loc      Pain Edu?      Excl. in GC?     Most recent vital signs: Vitals:   07/23/21 1302  BP: (!) 142/67  Pulse: 69  Resp: 14  Temp: 98.1 F (36.7 C)  SpO2: 97%     General: Awake, no distress.  CV:  Good peripheral perfusion.  Resp:  Normal effort.  Abd:  No distention.  Other:  Laceration to the scalp, stellate nature approximately 2 cm, just inferior to other small 2 cm laceration   ED Results / Procedures / Treatments   Labs (all labs ordered are listed, but only abnormal results are displayed) Labs Reviewed - No data to display   EKG     RADIOLOGY CT head reviewed by me, no acute abnormality CT cervical spine negative per radiology    PROCEDURES:  Critical Care performed:   03/05/23Marland KitchenLaceration Repair  Date/Time: 07/23/2021 3:51 PM Performed by: 09/20/2021, MD Authorized by: Jene Every, MD   Consent:    Consent obtained:  Verbal   Consent given by:  Patient   Risks discussed:  Pain Laceration details:    Location:  Scalp   Scalp location:  Crown   Length (cm):   2 Treatment:    Amount of cleaning:  Standard Skin repair:    Repair method:  Staples   Number of staples:  4 Approximation:    Approximation:  Close Repair type:    Repair type:  Simple Post-procedure details:    Dressing:  Non-adherent dressing   Procedure completion:  Tolerated well, no immediate complications   MEDICATIONS ORDERED IN ED: Medications - No data to display   IMPRESSION / MDM / ASSESSMENT AND PLAN / ED COURSE  I reviewed the triage vital signs and the nursing notes.   Patient presents after mechanical fall as detailed above.  CT head and cervical spine are reassuring, no acute abnormalities noted  Suffered laceration to the scalp, this was repaired as detailed above, 2 separate lacerations both requiring 2 staples  Recommended staple removal in 5 to 7 days with PCP          FINAL CLINICAL IMPRESSION(S) / ED DIAGNOSES   Final diagnoses:  Injury of head, initial  encounter  Scalp laceration, initial encounter     Rx / DC Orders   ED Discharge Orders     None        Note:  This document was prepared using Dragon voice recognition software and may include unintentional dictation errors.   Jene Every, MD 07/23/21 [phone]

## 2021-07-23 NOTE — Discharge Instructions (Signed)

## 2021-07-23 NOTE — ED Triage Notes (Signed)
Tripped and fell near his garage today.  Hit back of head and has laceration--bleeding controlled.  No loc.  No extremity pain or difficulty in movement.  Per ems fsbs 271--he says he has been having some trouble with elevated sugars lately.

## 2021-07-23 NOTE — Discharge Instructions (Signed)
Staples need to be removed in 5-7 days.

## 2021-07-28 ENCOUNTER — Other Ambulatory Visit: Payer: Self-pay

## 2021-07-28 ENCOUNTER — Ambulatory Visit: Payer: Medicare HMO | Admitting: Anesthesiology

## 2021-07-28 ENCOUNTER — Encounter
Admission: RE | Disposition: A | Discharge status: Home / Self Care | Payer: Self-pay | Source: Home / Self Care | Attending: Ophthalmology

## 2021-07-28 ENCOUNTER — Ambulatory Visit
Admission: RE | Admit: 2021-07-28 | Discharge: 2021-07-28 | Disposition: A | Discharge status: Home / Self Care | Payer: Medicare HMO | Attending: Ophthalmology | Admitting: Ophthalmology

## 2021-07-28 ENCOUNTER — Encounter: Payer: Self-pay | Admitting: Ophthalmology

## 2021-07-28 DIAGNOSIS — E1136 Type 2 diabetes mellitus with diabetic cataract: Secondary | ICD-10-CM | POA: Diagnosis not present

## 2021-07-28 DIAGNOSIS — H2512 Age-related nuclear cataract, left eye: Secondary | ICD-10-CM | POA: Insufficient documentation

## 2021-07-28 DIAGNOSIS — H25812 Combined forms of age-related cataract, left eye: Secondary | ICD-10-CM | POA: Diagnosis not present

## 2021-07-28 DIAGNOSIS — Z87891 Personal history of nicotine dependence: Secondary | ICD-10-CM | POA: Insufficient documentation

## 2021-07-28 HISTORY — PX: CATARACT EXTRACTION W/PHACO: SHX586

## 2021-07-28 LAB — GLUCOSE, CAPILLARY
Glucose-Capillary: 160 mg/dL — ABNORMAL HIGH (ref 70–99)
Glucose-Capillary: 158 mg/dL — ABNORMAL HIGH (ref 70–99)

## 2021-07-28 SURGERY — PHACOEMULSIFICATION, CATARACT, WITH IOL INSERTION
Anesthesia: Monitor Anesthesia Care | Site: Eye | Laterality: Left

## 2021-07-28 MED ORDER — SIGHTPATH DOSE#1 BSS IO SOLN
INTRAOCULAR | Status: DC | PRN
  Administered 2021-07-28: 2 mL

## 2021-07-28 MED ORDER — SIGHTPATH DOSE#1 BSS IO SOLN
INTRAOCULAR | Status: DC | PRN
  Administered 2021-07-28: 86 mL via OPHTHALMIC

## 2021-07-28 MED ORDER — ACETAMINOPHEN 160 MG/5ML PO SOLN: 325.0000 mg | ORAL | Status: DC | PRN

## 2021-07-28 MED ORDER — MOXIFLOXACIN HCL 0.5 % OP SOLN
OPHTHALMIC | Status: DC | PRN
  Administered 2021-07-28: 0.2 mL via OPHTHALMIC

## 2021-07-28 MED ORDER — BRIMONIDINE TARTRATE-TIMOLOL 0.2-0.5 % OP SOLN
OPHTHALMIC | Status: DC | PRN
  Administered 2021-07-28: 1 [drp] via OPHTHALMIC

## 2021-07-28 MED ORDER — ONDANSETRON HCL 4 MG/2ML IJ SOLN: 4.0000 mg | Freq: Once | INTRAMUSCULAR | Status: DC | PRN

## 2021-07-28 MED ORDER — ARMC OPHTHALMIC DILATING DROPS
1.0000 "application " | OPHTHALMIC | Status: DC | PRN
  Administered 2021-07-28 (×3): 1 via OPHTHALMIC

## 2021-07-28 MED ORDER — TETRACAINE HCL 0.5 % OP SOLN
1.0000 [drp] | OPHTHALMIC | Status: DC | PRN
  Administered 2021-07-28 (×3): 1 [drp] via OPHTHALMIC

## 2021-07-28 MED ORDER — SIGHTPATH DOSE#1 NA CHONDROIT SULF-NA HYALURON 40-17 MG/ML IO SOLN
INTRAOCULAR | Status: DC | PRN
  Administered 2021-07-28: 1 mL via INTRAOCULAR

## 2021-07-28 MED ORDER — ACETAMINOPHEN 325 MG PO TABS: 325.0000 mg | ORAL_TABLET | ORAL | Status: DC | PRN

## 2021-07-28 MED ORDER — SIGHTPATH DOSE#1 BSS IO SOLN
INTRAOCULAR | Status: DC | PRN
  Administered 2021-07-28: 15 mL via INTRAOCULAR

## 2021-07-28 MED ORDER — MIDAZOLAM HCL 2 MG/2ML IJ SOLN
INTRAMUSCULAR | Status: DC | PRN
  Administered 2021-07-28: .5 mg via INTRAVENOUS

## 2021-07-28 MED ORDER — FENTANYL CITRATE (PF) 100 MCG/2ML IJ SOLN
INTRAMUSCULAR | Status: DC | PRN
  Administered 2021-07-28: 50 ug via INTRAVENOUS

## 2021-07-28 SURGICAL SUPPLY — 12 items
CANNULA ANT/CHMB 27G (MISCELLANEOUS) IMPLANT
CANNULA ANT/CHMB 27GA (MISCELLANEOUS) IMPLANT
CATARACT SUITE SIGHTPATH (MISCELLANEOUS) ×2 IMPLANT
FEE CATARACT SUITE SIGHTPATH (MISCELLANEOUS) ×1 IMPLANT
GLOVE SURG ENC TEXT LTX SZ8 (GLOVE) ×2 IMPLANT
GLOVE SURG TRIUMPH 8.0 PF LTX (GLOVE) ×2 IMPLANT
LENS IOL TECNIS EYHANCE 22.0 (Intraocular Lens) ×1 IMPLANT
NDL FILTER BLUNT 18X1 1/2 (NEEDLE) ×1 IMPLANT
NEEDLE FILTER BLUNT 18X 1/2SAF (NEEDLE) ×1
NEEDLE FILTER BLUNT 18X1 1/2 (NEEDLE) ×1 IMPLANT
SYR 3ML LL SCALE MARK (SYRINGE) ×2 IMPLANT
WATER STERILE IRR 250ML POUR (IV SOLUTION) ×2 IMPLANT

## 2021-07-28 NOTE — Transfer of Care (Signed)
Immediate Anesthesia Transfer of Care Note  Patient: Troy Preston  Procedure(s) Performed: CATARACT EXTRACTION PHACO AND INTRAOCULAR LENS PLACEMENT (IOC) LEFT DIABETIC 25.13 02:01.6 (Left: Eye)  Patient Location: PACU  Anesthesia Type: MAC  Level of Consciousness: awake, alert  and patient cooperative  Airway and Oxygen Therapy: Patient Spontanous Breathing and Patient connected to supplemental oxygen  Post-op Assessment: Post-op Vital signs reviewed, Patient's Cardiovascular Status Stable, Respiratory Function Stable, Patent Airway and No signs of Nausea or vomiting  Post-op Vital Signs: Reviewed and stable  Complications: No notable events documented.

## 2021-07-28 NOTE — H&P (Signed)
[facility]   Primary Care Physician:  Barbette Reichmann, MD Ophthalmologist: Dr. Druscilla Brownie  Pre-Procedure History & Physical: HPI:  Troy Preston is a 88 y.o. male here for cataract surgery.   Past Medical History:  Diagnosis Date   Constipation    Diabetes mellitus without complication (HCC)    Esophagitis    HOH (hard of hearing)    Impaired glucose tolerance    Tinnitus    Urinary frequency    Wears hearing aid in both ears     Past Surgical History:  Procedure Laterality Date   CATARACT EXTRACTION W/PHACO Right 07/07/2021   Procedure: CATARACT EXTRACTION PHACO AND INTRAOCULAR LENS PLACEMENT (IOC) RIGHT DIABETIC 24.31 01:55.6;  Surgeon: Galen Manila, MD;  Location: [facility];  Service: Ophthalmology;  Laterality: Right;  Diabetic   Palmer fascietomy and release of Dupuytren's contracture     right little finger, left ring and little fingers    Prior to Admission medications   Medication Sig Start Date End Date Taking? Authorizing Provider  acetaminophen (TYLENOL) 325 MG tablet Take 650 mg by mouth every 6 (six) hours as needed.   Yes [provider]  ASPIRIN 81 PO Take by mouth daily.   Yes [provider]  bisacodyl (DULCOLAX) 5 MG EC tablet Take 5 mg by mouth.   Yes [provider]  Cinnamon 500 MG capsule Take 500 mg by mouth.   Yes [provider]  ibuprofen (ADVIL,MOTRIN) 100 MG tablet Take 100 mg by mouth every 6 (six) hours as needed for fever.   Yes [provider]  metFORMIN (GLUCOPHAGE) 1000 MG tablet Take 1,000 mg by mouth daily with breakfast.   Yes [provider]  omeprazole (PRILOSEC) 20 MG capsule Take 20 mg by mouth.   Yes [provider]  Testosterone (ANDROGEL) 20.25 MG/1.25GM (1.62%) GEL Apply 20 each topically daily. Patient needs to apply 2 pumps daily 12/26/14  Yes McGowan, Carollee Herter A, PA-C  vitamin B-12 (CYANOCOBALAMIN) 500 MCG tablet Take by mouth.   Yes [provider]  LORazepam (ATIVAN) 1 MG tablet take 1 pill 60 min before MRI and 2nd pill 15 min before MRI if needed. Patient should not drive when under influence of medicine. Patient not taking: Reported on 06/30/2021 11/06/14   [provider]    Allergies as of 06/19/2021   (No Known Allergies)    Family History  Problem Relation Age of Onset   Cancer Other    Diabetes Mellitus II Mother    Heart attack Father     Social History   Socioeconomic History   Marital status: Widowed    Spouse name: Not on file   Number of children: Not on file   Years of education: Not on file   Highest education level: Not on file  Occupational History   Not on file  Tobacco Use   Smoking status: Former    Types: Cigarettes   Smokeless tobacco: Current    Types: Chew   Tobacco comments:    Quit smoking "over 20 years ago"  Vaping Use   Vaping Use: Never used  Substance and Sexual Activity   Alcohol use: No    Alcohol/week: 0.0 standard drinks   Drug use: Not on file   Sexual activity: Not on file  Other Topics Concern   Not on file  Social History Narrative   Not on file   Social Determinants of Health   Financial Resource Strain: Not on file  Food Insecurity: Not on file  Transportation Needs: Not on file  Physical Activity: Not on file  Stress: Not on file  Social Connections: Not on file  Intimate Partner Violence: Not on file    Review of Systems: See HPI, otherwise negative ROS  Physical Exam: BP 133/69    Pulse 73    Temp (!) 97.3 F (36.3 C)    Wt 73.9 kg    SpO2 99%    BMI 24.78 kg/m  General:   Alert, cooperative in NAD Head:  Normocephalic and atraumatic. Respiratory:  Normal work of breathing. Cardiovascular:  RRR  Impression/Plan: Troy Preston is here for cataract surgery.  Risks, benefits, limitations, and alternatives regarding cataract surgery have been reviewed with the patient.  Questions have been answered.  All parties  agreeable.   Galen Manila, MD  07/28/2021, 7:48 AM

## 2021-07-28 NOTE — Anesthesia Preprocedure Evaluation (Signed)
Anesthesia Evaluation  Patient identified by MRN, date of birth, ID band Patient awake    Reviewed: Allergy & Precautions, NPO status   Airway Mallampati: II  TM Distance: >3 FB Neck ROM: Full    Dental no notable dental hx.    Pulmonary former smoker,    Pulmonary exam normal        Cardiovascular hypertension, Normal cardiovascular exam     Neuro/Psych    GI/Hepatic GERD  Medicated and Controlled,  Endo/Other  diabetes, Type 2  Renal/GU    BPH    Musculoskeletal negative musculoskeletal ROS (+)   Abdominal   Peds  Hematology negative hematology ROS (+)   Anesthesia Other Findings Cataracts Patient fell 1/5 - stitches in scalp. ED visit with negative head and spine CT.  Reproductive/Obstetrics                             Anesthesia Physical  Anesthesia Plan  ASA: 2  Anesthesia Plan: MAC   Post-op Pain Management: Minimal or no pain anticipated   Induction: Intravenous  PONV Risk Score and Plan: 1 and Midazolam, TIVA and Treatment may vary due to age or medical condition  Airway Management Planned: Natural Airway and Nasal Cannula  Additional Equipment:   Intra-op Plan:   Post-operative Plan:   Informed Consent: I have reviewed the patients History and Physical, chart, labs and discussed the procedure including the risks, benefits and alternatives for the proposed anesthesia with the patient or authorized representative who has indicated his/her understanding and acceptance.     Dental advisory given  Plan Discussed with: CRNA  Anesthesia Plan Comments:         Anesthesia Quick Evaluation

## 2021-07-28 NOTE — Op Note (Signed)
PREOPERATIVE DIAGNOSIS:  Nuclear sclerotic cataract of the left eye.   POSTOPERATIVE DIAGNOSIS:  Nuclear sclerotic cataract of the left eye.   OPERATIVE PROCEDURE:ORPROCALL@   SURGEON:  Galen Manila, MD.   ANESTHESIA:  Anesthesiologist: Jola Babinski, MD CRNA: Michaele Offer, CRNA  1.      Managed anesthesia care. 2.     0.81ml of Shugarcaine was instilled following the paracentesis   COMPLICATIONS:  None.   TECHNIQUE:   Stop and chop   DESCRIPTION OF PROCEDURE:  The patient was examined and consented in the preoperative holding area where the aforementioned topical anesthesia was applied to the left eye and then brought back to the Operating Room where the left eye was prepped and draped in the usual sterile ophthalmic fashion and a lid speculum was placed. A paracentesis was created with the side port blade and the anterior chamber was filled with viscoelastic. A near clear corneal incision was performed with the steel keratome. A continuous curvilinear capsulorrhexis was performed with a cystotome followed by the capsulorrhexis forceps. Hydrodissection and hydrodelineation were carried out with BSS on a blunt cannula. The lens was removed in a stop and chop  technique and the remaining cortical material was removed with the irrigation-aspiration handpiece. The capsular bag was inflated with viscoelastic and the Technis [id number] lens was placed in the capsular bag without complication. The remaining viscoelastic was removed from the eye with the irrigation-aspiration handpiece. The wounds were hydrated. The anterior chamber was flushed with BSS and the eye was inflated to physiologic pressure. 0.25ml Vigamox was placed in the anterior chamber. The wounds were found to be water tight. The eye was dressed with Combigan. The patient was given protective glasses to wear throughout the day and a shield with which to sleep tonight. The patient was also given drops with which to begin a drop regimen  today and will follow-up with me in one day. Implant Name Type Inv. Item Serial No. Manufacturer Lot No. LRB No. Used Action  LENS IOL TECNIS EYHANCE 22.0 - [id number] Intraocular Lens LENS IOL TECNIS EYHANCE 22.0 [id number] [employer]  Left 1 Implanted    Procedure(s) with comments: CATARACT EXTRACTION PHACO AND INTRAOCULAR LENS PLACEMENT (IOC) LEFT DIABETIC 25.13 02:01.6 (Left) - Diabetic  Electronically signed: Galen Manila 07/28/2021 8:15 AM

## 2021-07-28 NOTE — Anesthesia Postprocedure Evaluation (Signed)
Anesthesia Post Note  Patient: Troy Preston  Procedure(s) Performed: CATARACT EXTRACTION PHACO AND INTRAOCULAR LENS PLACEMENT (IOC) LEFT DIABETIC 25.13 02:01.6 (Left: Eye)     Patient location during evaluation: PACU Anesthesia Type: MAC Level of consciousness: awake Pain management: pain level controlled Vital Signs Assessment: post-procedure vital signs reviewed and stable Respiratory status: respiratory function stable Cardiovascular status: stable Postop Assessment: no signs of nausea or vomiting Anesthetic complications: no   No notable events documented.  Veda Canning

## 2021-07-29 ENCOUNTER — Encounter: Payer: Self-pay | Admitting: Ophthalmology

## 2021-07-29 DIAGNOSIS — Z4802 Encounter for removal of sutures: Secondary | ICD-10-CM | POA: Diagnosis not present

## 2021-07-29 DIAGNOSIS — R262 Difficulty in walking, not elsewhere classified: Secondary | ICD-10-CM | POA: Diagnosis not present

## 2021-07-29 DIAGNOSIS — R29898 Other symptoms and signs involving the musculoskeletal system: Secondary | ICD-10-CM | POA: Diagnosis not present

## 2021-08-03 DIAGNOSIS — Z23 Encounter for immunization: Secondary | ICD-10-CM | POA: Diagnosis not present

## 2021-08-03 DIAGNOSIS — Z111 Encounter for screening for respiratory tuberculosis: Secondary | ICD-10-CM | POA: Diagnosis not present

## 2021-08-20 DIAGNOSIS — E119 Type 2 diabetes mellitus without complications: Secondary | ICD-10-CM | POA: Diagnosis not present

## 2021-08-20 DIAGNOSIS — R269 Unspecified abnormalities of gait and mobility: Secondary | ICD-10-CM | POA: Diagnosis not present

## 2021-08-20 DIAGNOSIS — G3184 Mild cognitive impairment, so stated: Secondary | ICD-10-CM | POA: Diagnosis not present

## 2021-08-20 DIAGNOSIS — M170 Bilateral primary osteoarthritis of knee: Secondary | ICD-10-CM | POA: Diagnosis not present

## 2021-08-20 DIAGNOSIS — K219 Gastro-esophageal reflux disease without esophagitis: Secondary | ICD-10-CM | POA: Diagnosis not present

## 2021-08-20 DIAGNOSIS — N401 Enlarged prostate with lower urinary tract symptoms: Secondary | ICD-10-CM | POA: Diagnosis not present

## 2021-11-18 DIAGNOSIS — E785 Hyperlipidemia, unspecified: Secondary | ICD-10-CM | POA: Diagnosis not present

## 2021-11-18 DIAGNOSIS — I10 Essential (primary) hypertension: Secondary | ICD-10-CM | POA: Diagnosis not present

## 2021-11-18 DIAGNOSIS — R972 Elevated prostate specific antigen [PSA]: Secondary | ICD-10-CM | POA: Diagnosis not present

## 2021-11-18 DIAGNOSIS — R861 Abnormal level of hormones in specimens from male genital organs: Secondary | ICD-10-CM | POA: Diagnosis not present

## 2021-11-18 DIAGNOSIS — E119 Type 2 diabetes mellitus without complications: Secondary | ICD-10-CM | POA: Diagnosis not present

## 2022-02-05 DIAGNOSIS — H409 Unspecified glaucoma: Secondary | ICD-10-CM | POA: Diagnosis not present

## 2022-02-05 DIAGNOSIS — M179 Osteoarthritis of knee, unspecified: Secondary | ICD-10-CM | POA: Diagnosis not present

## 2022-02-12 DIAGNOSIS — R278 Other lack of coordination: Secondary | ICD-10-CM | POA: Diagnosis not present

## 2022-03-10 IMAGING — CT CT CERVICAL SPINE W/O CM
2 series · 12 of 27 positions shown, 15 images · non-contrast
Comparison: December 06, 2020.

CLINICAL DATA: Head laceration after fall.

EXAM:
CT HEAD WITHOUT CONTRAST
CT CERVICAL SPINE WITHOUT CONTRAST
TECHNIQUE: Multidetector CT imaging of the head and cervical spine was
performed following the standard protocol without intravenous
contrast. Multiplanar CT image reconstructions of the cervical spine
were also generated.

[Series 4: c spine soft · axial · 0.38mm/px · z∈[+71,+197]mm · 7 of 75 slices shown, 9 images]
[im 6/75  soft-tissue]
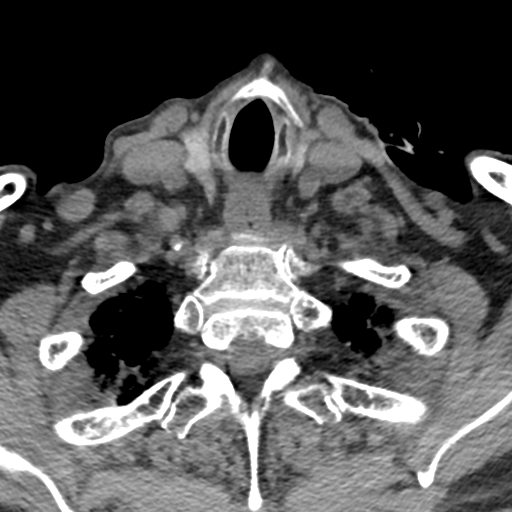
[im 6/75  bone]
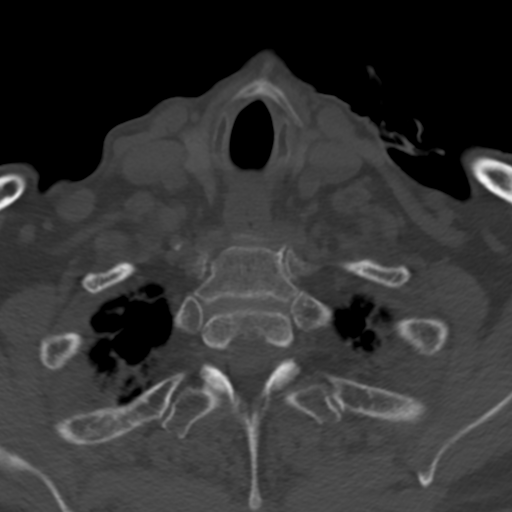
[im 18/75  bone]
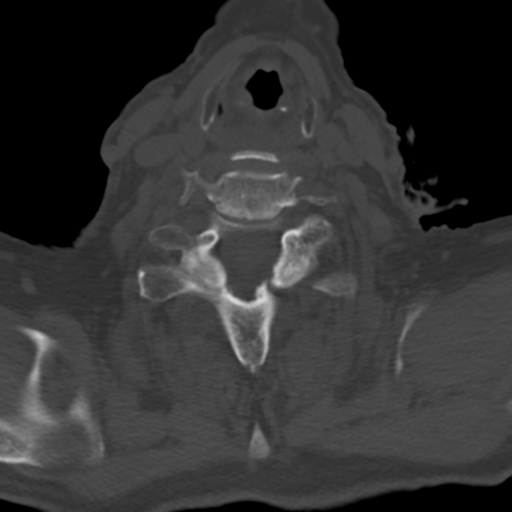
[im 29/75  bone]
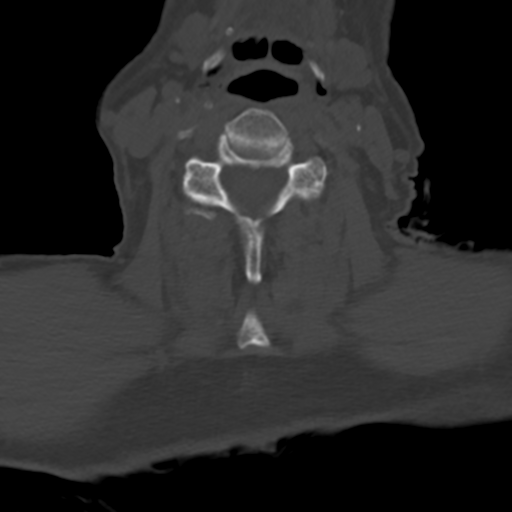
[im 40/75  bone]
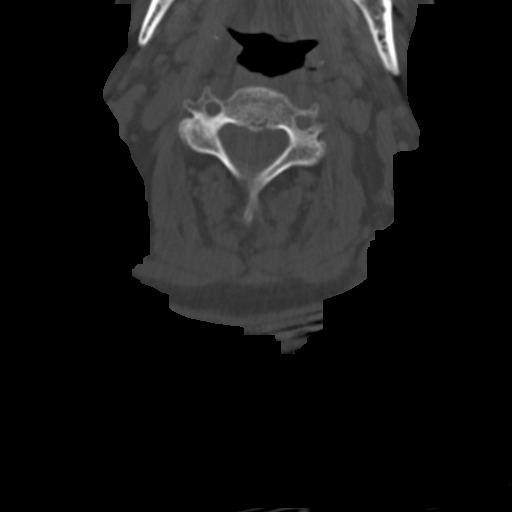
[im 46/75  soft-tissue]
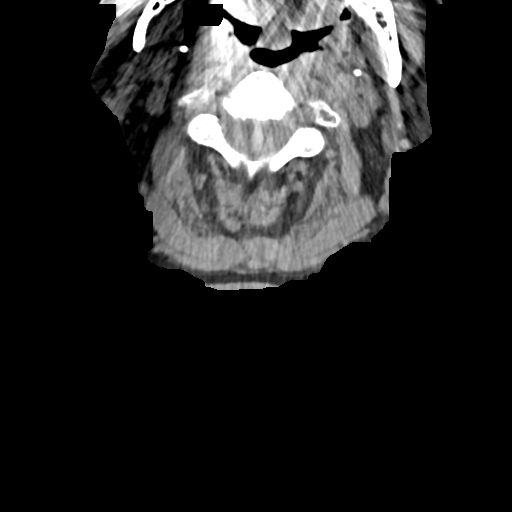
[im 46/75  bone]
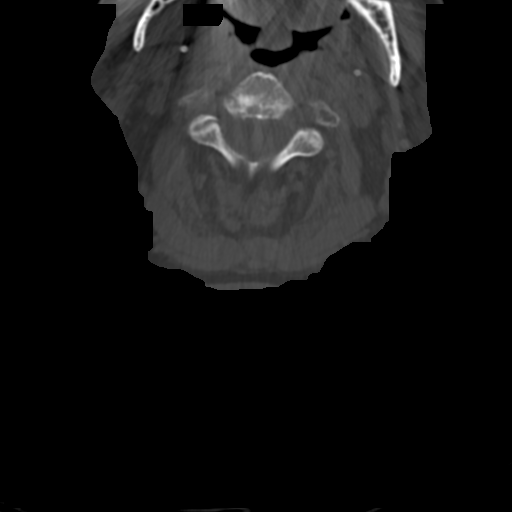
[im 57/75  bone]
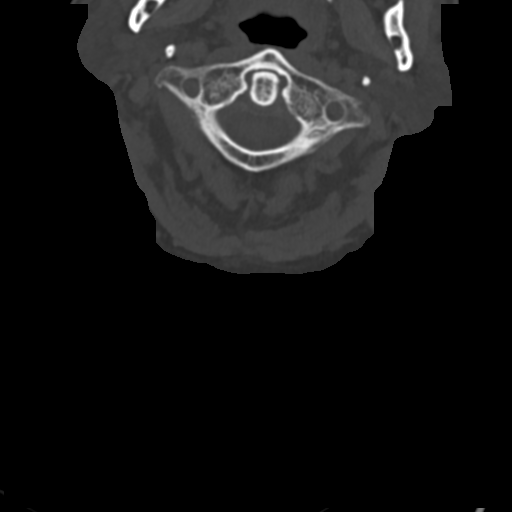
[im 69/75  bone]
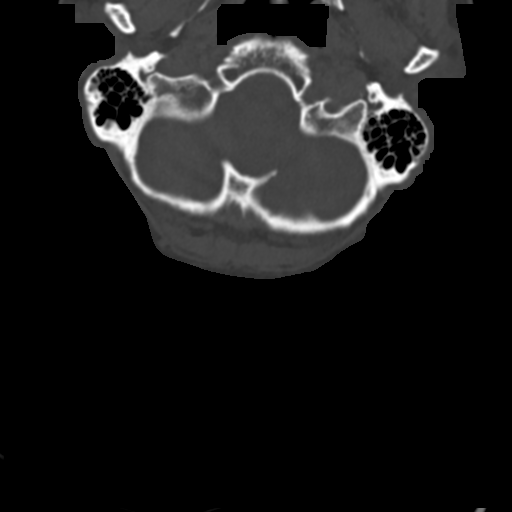

[Series 7: sagittal bone · sagittal · 0.26mm/px · 5 of 69 slices shown, 6 images]
[im 23/69  bone]
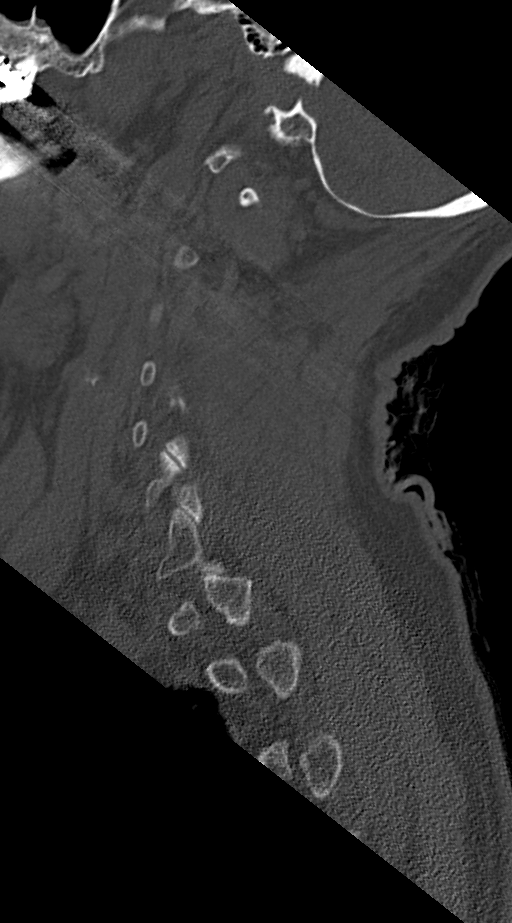
[im 29/69  bone]
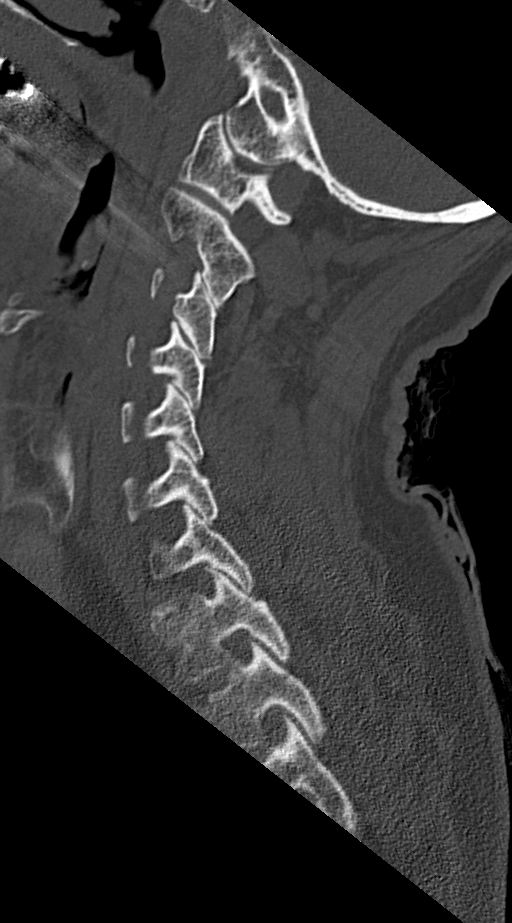
[im 35/69  soft-tissue]
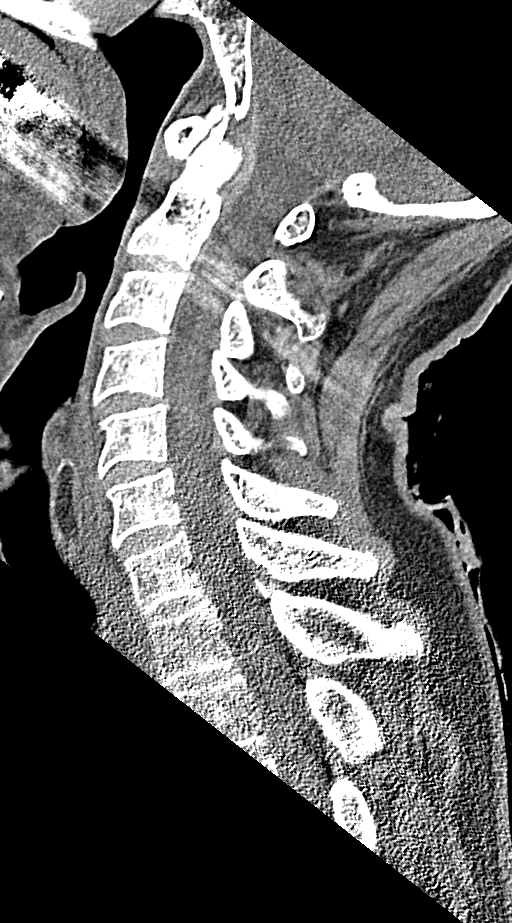
[im 35/69  bone]
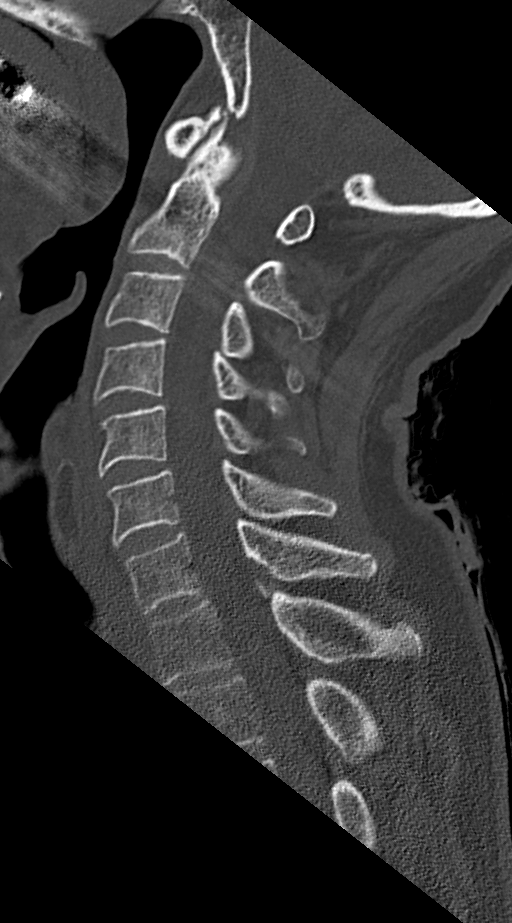
[im 40/69  bone]
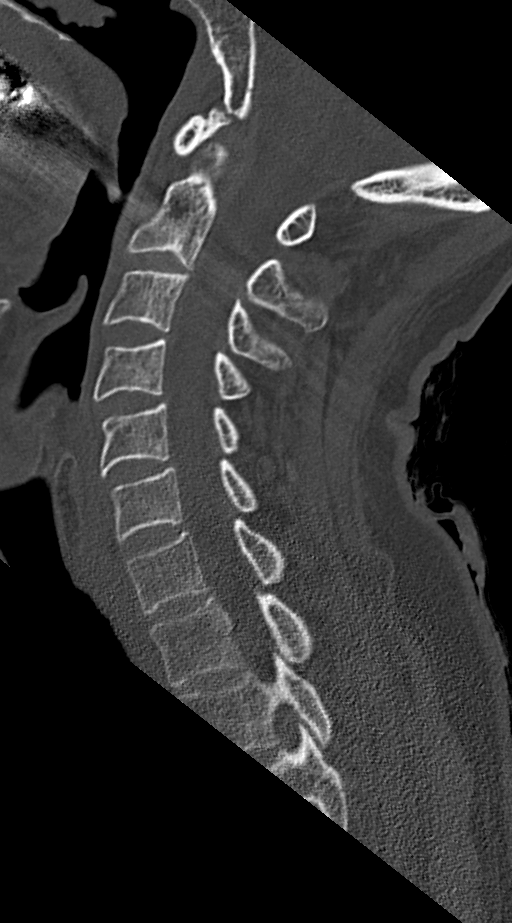
[im 46/69  bone]
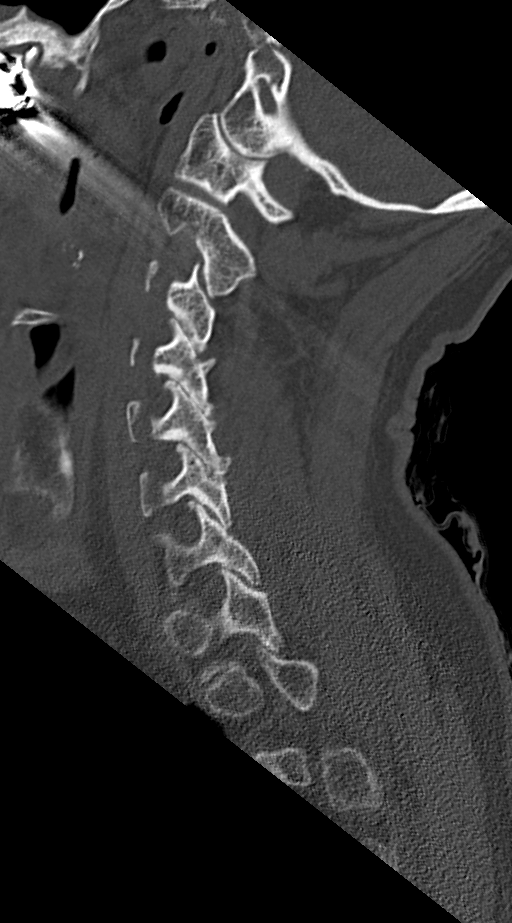

[12 of 27 positions shown; findings below may reference images not displayed]

FINDINGS: CT HEAD FINDINGS

Brain: Mild diffuse cortical atrophy is noted. Mild chronic ischemic
white matter disease is noted. No mass effect or midline shift is
noted. Ventricular size is within normal limits. There is no
evidence of mass lesion, hemorrhage or acute infarction.

Vascular: No hyperdense vessel or unexpected calcification.

Skull: Normal. Negative for fracture or focal lesion.

Sinuses/Orbits: No acute finding.

Other: At least 2 small posterior scalp hematomas are noted.

CT CERVICAL SPINE FINDINGS

Alignment: Normal.

Skull base and vertebrae: No acute fracture. No primary bone lesion
or focal pathologic process.

Soft tissues and spinal canal: No prevertebral fluid or swelling. No
visible canal hematoma.

Disc levels:  Normal.

Upper chest: Negative.

Other: Mild degenerative changes are seen right-sided posterior
facet joints.
IMPRESSION: Two small posterior scalp hematomas are noted. No acute intracranial
abnormality seen.

No acute abnormality seen in the cervical spine.

## 2022-08-07 ENCOUNTER — Other Ambulatory Visit: Payer: Self-pay

## 2022-08-07 DIAGNOSIS — W19XXXA Unspecified fall, initial encounter: Secondary | ICD-10-CM | POA: Diagnosis not present

## 2022-08-07 DIAGNOSIS — R519 Headache, unspecified: Secondary | ICD-10-CM | POA: Insufficient documentation

## 2022-08-07 DIAGNOSIS — I10 Essential (primary) hypertension: Secondary | ICD-10-CM | POA: Diagnosis not present

## 2022-08-07 DIAGNOSIS — R42 Dizziness and giddiness: Secondary | ICD-10-CM | POA: Insufficient documentation

## 2022-08-07 DIAGNOSIS — E871 Hypo-osmolality and hyponatremia: Secondary | ICD-10-CM | POA: Insufficient documentation

## 2022-08-07 DIAGNOSIS — Y92002 Bathroom of unspecified non-institutional (private) residence single-family (private) house as the place of occurrence of the external cause: Secondary | ICD-10-CM | POA: Insufficient documentation

## 2022-08-07 DIAGNOSIS — E1165 Type 2 diabetes mellitus with hyperglycemia: Secondary | ICD-10-CM | POA: Insufficient documentation

## 2022-08-07 LAB — BASIC METABOLIC PANEL
Anion gap: 11 (ref 5–15)
BUN: 19 mg/dL (ref 8–23)
CO2: 24 mmol/L (ref 22–32)
Calcium: 9 mg/dL (ref 8.9–10.3)
Chloride: 96 mmol/L — ABNORMAL LOW (ref 98–111)
Creatinine, Ser: 1.2 mg/dL (ref 0.61–1.24)
GFR, Estimated: 59 mL/min — ABNORMAL LOW (ref >60)
Glucose, Bld: 169 mg/dL — ABNORMAL HIGH (ref 70–99)
Potassium: 4.1 mmol/L (ref 3.5–5.1)
Sodium: 131 mmol/L — ABNORMAL LOW (ref 135–145)

## 2022-08-07 LAB — CBC
HCT: 36.5 % — ABNORMAL LOW (ref 39.0–52.0)
Hemoglobin: 12.2 g/dL — ABNORMAL LOW (ref 13.0–17.0)
MCH: 30.3 pg (ref 26.0–34.0)
MCHC: 33.4 g/dL (ref 30.0–36.0)
MCV: 90.6 fL (ref 80.0–100.0)
Platelets: 203 10*3/uL (ref 150–400)
RBC: 4.03 MIL/uL — ABNORMAL LOW (ref 4.22–5.81)
RDW: 12.5 % (ref 11.5–15.5)
WBC: 7 10*3/uL (ref 4.0–10.5)
nRBC: 0 % (ref 0.0–0.2)

## 2022-08-07 LAB — CBG MONITORING, ED: Glucose-Capillary: 146 mg/dL — ABNORMAL HIGH (ref 70–99)

## 2022-08-07 NOTE — ED Notes (Signed)
First Nurse Note: Pt arrives via [facility] from [facility] with complaints of Syncopal episode that lead to a fall.  Endorses dizziness with moving/standing. Denies hitting his head, no LOC, not on thinners.   VSS with EMS

## 2022-08-07 NOTE — ED Triage Notes (Signed)
Pt had an acute onset of dizziness that led to a fall. Denies any pain or injury from fall, but said he is still really dizzy.

## 2022-08-08 ENCOUNTER — Emergency Department: Payer: Medicare HMO

## 2022-08-08 ENCOUNTER — Emergency Department
Admission: EM | Admit: 2022-08-08 | Discharge: 2022-08-08 | Disposition: A | Discharge status: Home / Self Care | Payer: Medicare HMO | Attending: Emergency Medicine | Admitting: Emergency Medicine

## 2022-08-08 DIAGNOSIS — W19XXXA Unspecified fall, initial encounter: Secondary | ICD-10-CM

## 2022-08-08 LAB — URINALYSIS, ROUTINE W REFLEX MICROSCOPIC
Bilirubin Urine: NEGATIVE
Glucose, UA: NEGATIVE mg/dL
Hgb urine dipstick: NEGATIVE
Ketones, ur: NEGATIVE mg/dL
Leukocytes,Ua: NEGATIVE
Nitrite: NEGATIVE
Protein, ur: NEGATIVE mg/dL
Specific Gravity, Urine: 1.018 (ref 1.005–1.030)
pH: 6 (ref 5.0–8.0)

## 2022-08-08 NOTE — Discharge Instructions (Addendum)
Your blood work was reassuring.  Your CAT scan did not show any injury.

## 2022-08-08 NOTE — ED Notes (Signed)
Called [facility] for transport back to [facility] 

## 2022-08-08 NOTE — ED Provider Notes (Signed)
[facility] Provider Note    Event Date/Time   First MD Initiated Contact with Patient 08/08/22 0037     (approximate)   History   Dizziness   HPI  Troy Preston is a 89 y.o. male  with pmh DM who presents after a fall. Patient tells me he was in the bathroom standing when he turned around too fast and fell.  He is denying dizziness to me.  Tells me he did hit his head and felt to his back.  Did not have any headache denies back pain denies numbness tingling weakness.  Is otherwise been feeling well.  Says he has been able to ambulate since.  Denies chest pain shortness of breath fevers chills or recent illness.  Denies being on blood thinner.     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   Gallstone 12/28/2014   Blood glucose elevated 09/20/2014   External hemorrhoid 09/20/2014   CN (constipation) 05/15/2014   Esophagitis 05/15/2014   Borderline diabetes 05/15/2014   Buzzing in ear 05/15/2014     Physical Exam  Triage Vital Signs: ED Triage Vitals  Enc Vitals Group     BP 08/07/22 2019 131/71     Pulse Rate 08/07/22 2019 64     Resp 08/07/22 2019 18     Temp 08/07/22 2019 97.8 F (36.6 C)     Temp Source 08/07/22 2019 Oral     SpO2 08/07/22 2019 100 %     Weight 08/07/22 2023 168 lb (76.2 kg)     Height 08/07/22 2023 5\' 6"  (1.676 m)     Head Circumference --      Peak Flow --      Pain Score 08/07/22 2020 0     Pain Loc --      Pain Edu? --      Excl. in [place]? --     Most recent vital signs: Vitals:   08/08/22 0130 08/08/22 0200  BP: 138/69 (!) 152/73  Pulse: 63 (!) 58  Resp: 18  15  Temp:    SpO2: 100% 100%     General: Awake, no distress.  CV:  Good peripheral perfusion.  Resp:  Normal effort.  Abd:  No distention.  Neuro:             Awake, Alert, Oriented x 3  Other:  Aox3, nml speech  PERRL, EOMI, face symmetric, nml tongue movement  5/5 strength in the BL upper and lower extremities  Sensation grossly intact in the BL upper and lower extremities  Finger-nose-finger intact BL  No signs of trauma, no C, T or L-spine tenderness, no ecchymosis   ED Results / Procedures / Treatments  Labs (all labs ordered are listed, but only abnormal results are displayed) Labs Reviewed  BASIC METABOLIC PANEL - Abnormal; Notable for the following components:      Result Value   Sodium 131 (*)    Chloride 96 (*)    Glucose, Bld 169 (*)    GFR, Estimated 59 (*)  All other components within normal limits  CBC - Abnormal; Notable for the following components:   RBC 4.03 (*)    Hemoglobin 12.2 (*)    HCT 36.5 (*)    All other components within normal limits  URINALYSIS, ROUTINE W REFLEX MICROSCOPIC - Abnormal; Notable for the following components:   Color, Urine YELLOW (*)    APPearance CLEAR (*)    All other components within normal limits  CBG MONITORING, ED - Abnormal; Notable for the following components:   Glucose-Capillary 146 (*)    All other components within normal limits     EKG  EKG reviewed and interpreted by myself shows right bundle branch block left anterior fascicular block sinus rhythm no acute ischemic changes   RADIOLOGY I reviewed and interpreted the CT scan of the brain which does not show any acute intracranial process    PROCEDURES:  Critical Care performed: No  Procedures   MEDICATIONS ORDERED IN ED: Medications - No data to display   IMPRESSION / MDM / [place] / ED COURSE  I reviewed the triage vital signs and the nursing notes.                              Patient's presentation is most consistent  with acute complicated illness / injury requiring diagnostic workup.  Differential diagnosis includes, but is not limited to, syncope, mechanical fall, less likely vertigo or posterior stroke, intracranial injury Patient is an 89 year old male presents after fall.  Per triage note patient had acute onset of dizziness that led to a fall however patient tells me he just turned around too quickly in the bathroom but did not get dizzy fell because he turned around quickly.  Did hit his head and fell onto his back.  Says he been able to ambulate since and is not having any dizziness.  Denies vision change numbness tingling weakness.  Denies headache or back pain denies chest pain or shortness of breath.  He is mildly hypertensive heart rate in the 50s.  EKG showing bifascicular block but sinus rhythm.  Denies any cardiopulmonary complaints.  Labs are notable for mild hyponatremia CBC largely within normal limits UA negative.  Did obtain CT head given patient's age with fall with head strike and this is negative for acute abnormality.  Patient otherwise has a reassuring neurologic exam.  He is appropriate for discharge.        FINAL CLINICAL IMPRESSION(S) / ED DIAGNOSES   Final diagnoses:  Fall, initial encounter     Rx / DC Orders   ED Discharge Orders     None        Note:  This document was prepared using Dragon voice recognition software and may include unintentional dictation errors.   Rada Hay, MD 08/08/22 [phone]

## 2022-08-08 NOTE — ED Notes (Signed)
RN attempted to call report to [facility] facility at [phone].  No answer.

## 2022-09-17 ENCOUNTER — Emergency Department: Payer: Medicare HMO

## 2022-09-17 ENCOUNTER — Emergency Department
Admission: EM | Admit: 2022-09-17 | Discharge: 2022-09-18 | Disposition: A | Discharge status: Skilled Nursing Facility | Payer: Medicare HMO | Attending: Emergency Medicine | Admitting: Emergency Medicine

## 2022-09-17 DIAGNOSIS — E871 Hypo-osmolality and hyponatremia: Secondary | ICD-10-CM | POA: Insufficient documentation

## 2022-09-17 DIAGNOSIS — S0003XA Contusion of scalp, initial encounter: Secondary | ICD-10-CM | POA: Insufficient documentation

## 2022-09-17 DIAGNOSIS — F0390 Unspecified dementia without behavioral disturbance: Secondary | ICD-10-CM | POA: Insufficient documentation

## 2022-09-17 DIAGNOSIS — E119 Type 2 diabetes mellitus without complications: Secondary | ICD-10-CM | POA: Insufficient documentation

## 2022-09-17 DIAGNOSIS — W19XXXA Unspecified fall, initial encounter: Secondary | ICD-10-CM | POA: Insufficient documentation

## 2022-09-17 DIAGNOSIS — S0990XA Unspecified injury of head, initial encounter: Secondary | ICD-10-CM | POA: Diagnosis present

## 2022-09-17 LAB — CBC WITH DIFFERENTIAL/PLATELET
Abs Immature Granulocytes: 0.04 10*3/uL (ref 0.00–0.07)
Basophils Absolute: 0 10*3/uL (ref 0.0–0.1)
Basophils Relative: 0 %
Eosinophils Absolute: 0.5 10*3/uL (ref 0.0–0.5)
Eosinophils Relative: 6 %
HCT: 30.6 % — ABNORMAL LOW (ref 39.0–52.0)
Hemoglobin: 9.9 g/dL — ABNORMAL LOW (ref 13.0–17.0)
Immature Granulocytes: 1 %
Lymphocytes Relative: 12 %
Lymphs Abs: 1 10*3/uL (ref 0.7–4.0)
MCH: 29.6 pg (ref 26.0–34.0)
MCHC: 32.4 g/dL (ref 30.0–36.0)
MCV: 91.3 fL (ref 80.0–100.0)
Monocytes Absolute: 0.6 10*3/uL (ref 0.1–1.0)
Monocytes Relative: 8 %
Neutro Abs: 6 10*3/uL (ref 1.7–7.7)
Neutrophils Relative %: 73 %
Platelets: 299 10*3/uL (ref 150–400)
RBC: 3.35 MIL/uL — ABNORMAL LOW (ref 4.22–5.81)
RDW: 12.7 % (ref 11.5–15.5)
WBC: 8.2 10*3/uL (ref 4.0–10.5)
nRBC: 0 % (ref 0.0–0.2)

## 2022-09-17 LAB — BASIC METABOLIC PANEL
Anion gap: 12 (ref 5–15)
BUN: 20 mg/dL (ref 8–23)
CO2: 24 mmol/L (ref 22–32)
Calcium: 8.7 mg/dL — ABNORMAL LOW (ref 8.9–10.3)
Chloride: 93 mmol/L — ABNORMAL LOW (ref 98–111)
Creatinine, Ser: 1.18 mg/dL (ref 0.61–1.24)
GFR, Estimated: >60 mL/min (ref >60)
Glucose, Bld: 212 mg/dL — ABNORMAL HIGH (ref 70–99)
Potassium: 3.4 mmol/L — ABNORMAL LOW (ref 3.5–5.1)
Sodium: 129 mmol/L — ABNORMAL LOW (ref 135–145)

## 2022-09-17 MED ORDER — POTASSIUM CHLORIDE 20 MEQ PO PACK
20.0000 meq | PACK | Freq: Once | ORAL | Status: AC
Start: 2022-09-17 — End: 2022-09-17
  Administered 2022-09-17: 20 meq via ORAL
  Filled 2022-09-17: qty 1

## 2022-09-17 MED ORDER — POTASSIUM CHLORIDE CRYS ER 20 MEQ PO TBCR: 20.0000 meq | EXTENDED_RELEASE_TABLET | Freq: Once | ORAL | Status: DC

## 2022-09-17 NOTE — ED Notes (Signed)
Attempted to call [facility] twice without answer.

## 2022-09-17 NOTE — ED Notes (Signed)
Wound cleaned with sterile water. Small abrasion with some swelling. Bleeding controlled. Daughter at bedside at this time

## 2022-09-17 NOTE — ED Notes (Signed)
Called [facility] for transport back to [facility]

## 2022-09-17 NOTE — ED Triage Notes (Signed)
BIB EMS/ unwitnessed fall @ SNF/ lac to back of head/ denies LOC/ no blood thinners/ obvious rotation to left hip/ hx of dementia/ full code

## 2022-09-17 NOTE — ED Notes (Signed)
EKG handed to [place], MD

## 2022-09-17 NOTE — ED Notes (Signed)
Called and Cancelled [facility] transport. Daughter to take to [facility]

## 2022-09-17 NOTE — ED Provider Notes (Signed)
[facility] Provider Note    Event Date/Time   First MD Initiated Contact with Patient 09/17/22 2144     (approximate)   History   Chief Complaint Fall   HPI  Troy Preston is a 89 y.o. male with past medical history of diabetes and dementia who presents to the ED following fall.  Per EMS, patient had an unwitnessed fall at his nursing facility, was found down on the ground by staff.  Patient reports that he lost his balance and fell backwards, striking his head, but he denies any loss of consciousness.  He does not take any blood thinners and denies any pain related to the fall.  EMS reported concern for shortening and rotation to his left hip, but patient denies any pain in his hip and is able to lift it without difficulty.  He denies any headache, neck pain, chest pain, abdominal pain, or extremity pain.      Physical Exam   Triage Vital Signs: ED Triage Vitals  Enc Vitals Group     BP      Pulse      Resp      Temp      Temp src      SpO2      Weight      Height      Head Circumference      Peak Flow      Pain Score      Pain Loc      Pain Edu?      Excl. in [place]?     Most recent vital signs: Vitals:   09/17/22 2148 09/17/22 2153  BP:  (!) 122/59  Pulse: 72   Resp: 16   Temp: 98.1 F (36.7 C)   SpO2: 97%     Constitutional: Alert and oriented. Eyes: Conjunctivae are normal. Head: Small superficial laceration noted to posterior scalp with mild surrounding edema.  No step-offs noted. Nose: No congestion/rhinnorhea. Mouth/Throat: Mucous membranes are moist.  Neck: Cervical collar in place, no midline tenderness noted. Cardiovascular: Normal rate, regular rhythm. Grossly normal heart sounds.  2+ radial pulses bilaterally. Respiratory: Normal respiratory effort.  No retractions. Lungs CTAB.  No chest wall tenderness to palpation. Gastrointestinal: Soft and nontender. No distention. Musculoskeletal: No lower extremity tenderness  nor edema.  No upper extremity bony tenderness to palpation.  Range of motion intact to all 4 extremities without pain. Neurologic:  Normal speech and language. No gross focal neurologic deficits are appreciated.    ED Results / Procedures / Treatments   Labs (all labs ordered are listed, but only abnormal results are displayed) Labs Reviewed  CBC WITH DIFFERENTIAL/PLATELET - Abnormal; Notable for the following components:      Result Value   RBC 3.35 (*)    Hemoglobin 9.9 (*)    HCT 30.6 (*)    All other components within normal limits  BASIC METABOLIC PANEL - Abnormal; Notable for the following components:   Sodium 129 (*)    Potassium 3.4 (*)    Chloride 93 (*)    Glucose, Bld 212 (*)    Calcium 8.7 (*)    All other components within normal limits     EKG  ED ECG REPORT I, Blake Divine, the attending physician, personally viewed and interpreted this ECG.   Date: 09/17/2022  EKG Time: 22:05  Rate: 70  Rhythm: normal sinus rhythm  Axis: LAD  Intervals:right bundle branch block and left anterior fascicular block  ST&T Change: None  RADIOLOGY CT head reviewed and interpreted by me with no hemorrhage or midline shift.  CT cervical spine reviewed and interpreted by me with no fracture or dislocation.  PROCEDURES:  Critical Care performed: No  Procedures   MEDICATIONS ORDERED IN ED: Medications  potassium chloride (KLOR-CON) packet 20 mEq (has no administration in time range)     IMPRESSION / MDM / ASSESSMENT AND PLAN / ED COURSE  I reviewed the triage vital signs and the nursing notes.                              89 y.o. male with past medical history of diabetes and dementia who presents to the ED following unwitnessed fall at his nursing facility, striking the back of his head.  Patient's presentation is most consistent with acute presentation with potential threat to life or bodily function.  Differential diagnosis includes, but is not limited to,  intracranial injury, cervical spine injury, anemia, electrolyte abnormality, AKI, arrhythmia.  Patient well-appearing and in no acute distress, vital signs are unremarkable and he has no focal neurologic deficits on exam.  He does appear to have struck the back of his head with superficial laceration not in need of repair, small associated hematoma noted.  He was placed in cervical collar, CT head and cervical spine performed but are both negative for acute process.  EKG shows no evidence of arrhythmia or ischemia.  Labs with mild anemia but no evidence of bleeding at this time, no significant leukocytosis noted.  Mild hyperglycemia as well as hyponatremia noted with mild hypokalemia that we will replete.  No significant AKI noted and patient is appropriate for discharge home with PCP follow-up for recheck of labs.  Patient agrees with plan.      FINAL CLINICAL IMPRESSION(S) / ED DIAGNOSES   Final diagnoses:  Fall, initial encounter  Hematoma of scalp, initial encounter  Hyponatremia     Rx / DC Orders   ED Discharge Orders     None        Note:  This document was prepared using Dragon voice recognition software and may include unintentional dictation errors.   Blake Divine, MD 09/17/22 2240

## 2022-09-18 NOTE — ED Notes (Signed)
  Patient discharged and taken back to facility by daughter.

## 2023-04-04 ENCOUNTER — Emergency Department
Admission: EM | Admit: 2023-04-04 | Discharge: 2023-04-05 | Disposition: A | Discharge status: Home / Self Care | Payer: Medicare HMO | Attending: Emergency Medicine | Admitting: Emergency Medicine

## 2023-04-04 ENCOUNTER — Other Ambulatory Visit: Payer: Self-pay

## 2023-04-04 ENCOUNTER — Emergency Department: Payer: Medicare HMO

## 2023-04-04 ENCOUNTER — Encounter: Payer: Self-pay | Admitting: Emergency Medicine

## 2023-04-04 DIAGNOSIS — W19XXXA Unspecified fall, initial encounter: Secondary | ICD-10-CM

## 2023-04-04 DIAGNOSIS — Z7984 Long term (current) use of oral hypoglycemic drugs: Secondary | ICD-10-CM | POA: Insufficient documentation

## 2023-04-04 DIAGNOSIS — W01198A Fall on same level from slipping, tripping and stumbling with subsequent striking against other object, initial encounter: Secondary | ICD-10-CM | POA: Diagnosis not present

## 2023-04-04 DIAGNOSIS — E119 Type 2 diabetes mellitus without complications: Secondary | ICD-10-CM | POA: Diagnosis not present

## 2023-04-04 DIAGNOSIS — S0990XA Unspecified injury of head, initial encounter: Secondary | ICD-10-CM | POA: Diagnosis present

## 2023-04-04 DIAGNOSIS — Z7982 Long term (current) use of aspirin: Secondary | ICD-10-CM | POA: Insufficient documentation

## 2023-04-04 LAB — URINALYSIS, ROUTINE W REFLEX MICROSCOPIC
Bilirubin Urine: NEGATIVE
Glucose, UA: NEGATIVE mg/dL
Hgb urine dipstick: NEGATIVE
Ketones, ur: 5 mg/dL — AB
Leukocytes,Ua: NEGATIVE
Nitrite: NEGATIVE
Protein, ur: 30 mg/dL — AB
Specific Gravity, Urine: 1.02 (ref 1.005–1.030)
Squamous Epithelial / HPF: 0 /HPF (ref 0–5)
pH: 8 (ref 5.0–8.0)

## 2023-04-04 LAB — BASIC METABOLIC PANEL
Anion gap: 12 (ref 5–15)
BUN: 21 mg/dL (ref 8–23)
CO2: 23 mmol/L (ref 22–32)
Calcium: 8.9 mg/dL (ref 8.9–10.3)
Chloride: 100 mmol/L (ref 98–111)
Creatinine, Ser: 1.14 mg/dL (ref 0.61–1.24)
GFR, Estimated: >60 mL/min (ref >60)
Glucose, Bld: 189 mg/dL — ABNORMAL HIGH (ref 70–99)
Potassium: 4.3 mmol/L (ref 3.5–5.1)
Sodium: 135 mmol/L (ref 135–145)

## 2023-04-04 LAB — CBC
HCT: 37.4 % — ABNORMAL LOW (ref 39.0–52.0)
Hemoglobin: 11.9 g/dL — ABNORMAL LOW (ref 13.0–17.0)
MCH: 30.4 pg (ref 26.0–34.0)
MCHC: 31.8 g/dL (ref 30.0–36.0)
MCV: 95.7 fL (ref 80.0–100.0)
Platelets: 148 10*3/uL — ABNORMAL LOW (ref 150–400)
RBC: 3.91 MIL/uL — ABNORMAL LOW (ref 4.22–5.81)
RDW: 13 % (ref 11.5–15.5)
WBC: 7.2 10*3/uL (ref 4.0–10.5)
nRBC: 0 % (ref 0.0–0.2)

## 2023-04-04 NOTE — ED Provider Notes (Signed)
[facility] Provider Note    Event Date/Time   First MD Initiated Contact with Patient 04/04/23 2300     (approximate)   History   Fall and Head Injury   HPI  Troy Preston is a 89 y.o. male with history of diabetes who presents to the emergency department from his nursing facility after he fell.  States he is not sure why he fell but thinks he may have lost his balance.  States he did hit his head when he fell and landed on his back.  No loss of consciousness.  States he is not on any blood thinners.  Denies any pain at this time.   History provided by patient.    Past Medical History:  Diagnosis Date   Constipation    Diabetes mellitus without complication (HCC)    Esophagitis    HOH (hard of hearing)    Impaired glucose tolerance    Tinnitus    Urinary frequency    Wears hearing aid in both ears     Past Surgical History:  Procedure Laterality Date   CATARACT EXTRACTION W/PHACO Right 07/07/2021   Procedure: CATARACT EXTRACTION PHACO AND INTRAOCULAR LENS PLACEMENT (IOC) RIGHT DIABETIC 24.31 01:55.6;  Surgeon: Galen Manila, MD;  Location: [facility];  Service: Ophthalmology;  Laterality: Right;  Diabetic   CATARACT EXTRACTION W/PHACO Left 07/28/2021   Procedure: CATARACT EXTRACTION PHACO AND INTRAOCULAR LENS PLACEMENT (IOC) LEFT DIABETIC 25.13 02:01.6;  Surgeon: Galen Manila, MD;  Location: [facility];  Service: Ophthalmology;  Laterality: Left;  Diabetic   Palmer fascietomy and release of Dupuytren's contracture     right little finger, left ring and little fingers    MEDICATIONS:  Prior to Admission medications   Medication Sig Start Date End Date Taking? Authorizing Provider  acetaminophen (TYLENOL) 325 MG tablet Take 650 mg by mouth every 6 (six) hours as needed.    [provider]  ASPIRIN 81 PO Take by mouth daily.    [provider]  bisacodyl (DULCOLAX) 5 MG EC tablet Take 5 mg by mouth.     [provider]  Cinnamon 500 MG capsule Take 500 mg by mouth.    [provider]  ibuprofen (ADVIL,MOTRIN) 100 MG tablet Take 100 mg by mouth every 6 (six) hours as needed for fever.    [provider]  LORazepam (ATIVAN) 1 MG tablet take 1 pill 60 min before MRI and 2nd pill 15 min before MRI if needed. Patient should not drive when under influence of medicine. Patient not taking: Reported on 06/30/2021 11/06/14   [provider]  metFORMIN (GLUCOPHAGE) 1000 MG tablet Take 1,000 mg by mouth daily with breakfast.    [provider]  omeprazole (PRILOSEC) 20 MG capsule Take 20 mg by mouth.    [provider]  Testosterone (ANDROGEL) 20.25 MG/1.25GM (1.62%) GEL Apply 20 each topically daily. Patient needs to apply 2 pumps daily 12/26/14   Michiel Cowboy A, PA-C  vitamin B-12 (CYANOCOBALAMIN) 500 MCG tablet Take by mouth.    [provider]    Physical Exam   Triage Vital Signs: ED Triage Vitals  Encounter Vitals Group     BP 04/04/23 2142 (!) 141/70     Systolic BP Percentile --      Diastolic BP Percentile --      Pulse Rate 04/04/23 2142 100     Resp 04/04/23 2142 20     Temp 04/04/23 2142 98 F (  36.7 C)     Temp Source 04/04/23 2142 Oral     SpO2 04/04/23 2142 98 %     Weight 04/04/23 2140 163 lb 5.8 oz (74.1 kg)     Height --      Head Circumference --      Peak Flow --      Pain Score 04/04/23 2139 2     Pain Loc --      Pain Education --      Exclude from Growth Chart --     Most recent vital signs: Vitals:   04/04/23 2142  BP: (!) 141/70  Pulse: 100  Resp: 20  Temp: 98 F (36.7 C)  SpO2: 98%     CONSTITUTIONAL: Alert, responds appropriately to questions.  Elderly, hard of hearing, in no distress HEAD: Normocephalic; atraumatic EYES: Conjunctivae clear, PERRL, EOMI ENT: normal nose; no rhinorrhea; moist mucous membranes; pharynx without lesions noted; no dental injury; no septal hematoma, no  epistaxis; no facial deformity or bony tenderness NECK: Supple, no midline spinal tenderness, step-off or deformity; trachea midline CARD: RRR; S1 and S2 appreciated; no murmurs, no clicks, no rubs, no gallops RESP: Normal chest excursion without splinting or tachypnea; breath sounds clear and equal bilaterally; no wheezes, no rhonchi, no rales; no hypoxia or respiratory distress CHEST:  chest wall stable, no crepitus or ecchymosis or deformity, nontender to palpation; no flail chest ABD/GI: Non-distended; soft, non-tender, no rebound, no guarding; no ecchymosis or other lesions noted PELVIS:  stable, nontender to palpation BACK:  The back appears normal; no midline spinal tenderness, step-off or deformity EXT: Normal ROM in all joints; no edema; normal capillary refill; no cyanosis, no bony tenderness or bony deformity of patient's extremities, no joint effusions, compartments are soft, extremities are warm and well-perfused, no ecchymosis SKIN: Normal color for age and race; warm NEURO: No facial asymmetry, normal speech, moving all extremities equally  ED Results / Procedures / Treatments   LABS: (all labs ordered are listed, but only abnormal results are displayed) Labs Reviewed  BASIC METABOLIC PANEL - Abnormal; Notable for the following components:      Result Value   Glucose, Bld 189 (*)    All other components within normal limits  CBC - Abnormal; Notable for the following components:   RBC 3.91 (*)    Hemoglobin 11.9 (*)    HCT 37.4 (*)    Platelets 148 (*)    All other components within normal limits  URINALYSIS, ROUTINE W REFLEX MICROSCOPIC - Abnormal; Notable for the following components:   Color, Urine YELLOW (*)    APPearance CLEAR (*)    Ketones, ur 5 (*)    Protein, ur 30 (*)    Bacteria, UA RARE (*)    All other components within normal limits     EKG:  EKG Interpretation Date/Time:  Monday April 04 2023 21:46:43 EDT Ventricular Rate:  82 PR  Interval:  160 QRS Duration:  118 QT Interval:  376 QTC Calculation: 439 R Axis:   -75  Text Interpretation: Sinus rhythm with Premature supraventricular complexes Incomplete right bundle branch block Left anterior fascicular block Minimal voltage criteria for LVH, may be normal variant ( R in aVL ) ST & T wave abnormality, consider lateral ischemia Abnormal ECG When compared with ECG of 17-Sep-2022 22:05, PREVIOUS ECG IS PRESENT Confirmed by Rochele Raring [phone]) on 04/04/2023 11:02:55 PM          RADIOLOGY: My personal review and interpretation of imaging:  CT showed no acute abnormality.  I have personally reviewed all radiology reports. CT Head Wo Contrast  Result Date: 04/04/2023 CLINICAL DATA:  Recent trip and fall with headaches and neck pain, initial encounter EXAM: CT HEAD WITHOUT CONTRAST CT CERVICAL SPINE WITHOUT CONTRAST TECHNIQUE: Multidetector CT imaging of the head and cervical spine was performed following the standard protocol without intravenous contrast. Multiplanar CT image reconstructions of the cervical spine were also generated. RADIATION DOSE REDUCTION: This exam was performed according to the departmental dose-optimization program which includes automated exposure control, adjustment of the mA and/or kV according to patient size and/or use of iterative reconstruction technique. COMPARISON:  09/17/2022 FINDINGS: CT HEAD FINDINGS Brain: No evidence of acute infarction, hemorrhage, hydrocephalus, extra-axial collection or mass lesion/mass effect. Chronic atrophic and ischemic changes are noted. Vascular: No hyperdense vessel or unexpected calcification. Skull: Normal. Negative for fracture or focal lesion. Sinuses/Orbits: No acute finding. Other: None. CT CERVICAL SPINE FINDINGS Alignment: Within normal limits. Skull base and vertebrae: 7 cervical segments are well visualized. Vertebral body height is well maintained. Multilevel facet hypertrophic changes are seen. Mild  osteophytic changes are noted. No acute fracture or acute facet is noted. Soft tissues and spinal canal: Surrounding soft tissue structures are within normal limits. Upper chest: Visualized lung apices show pleural and parenchymal scarring bilaterally. Other: None IMPRESSION: CT of the head: Chronic atrophic and ischemic changes without acute abnormality. CT of the cervical spine: Multilevel degenerative change without acute abnormality. Electronically Signed   By: Alcide Clever M.D.   On: 04/04/2023 23:12   CT Cervical Spine Wo Contrast  Result Date: 04/04/2023 CLINICAL DATA:  Recent trip and fall with headaches and neck pain, initial encounter EXAM: CT HEAD WITHOUT CONTRAST CT CERVICAL SPINE WITHOUT CONTRAST TECHNIQUE: Multidetector CT imaging of the head and cervical spine was performed following the standard protocol without intravenous contrast. Multiplanar CT image reconstructions of the cervical spine were also generated. RADIATION DOSE REDUCTION: This exam was performed according to the departmental dose-optimization program which includes automated exposure control, adjustment of the mA and/or kV according to patient size and/or use of iterative reconstruction technique. COMPARISON:  09/17/2022 FINDINGS: CT HEAD FINDINGS Brain: No evidence of acute infarction, hemorrhage, hydrocephalus, extra-axial collection or mass lesion/mass effect. Chronic atrophic and ischemic changes are noted. Vascular: No hyperdense vessel or unexpected calcification. Skull: Normal. Negative for fracture or focal lesion. Sinuses/Orbits: No acute finding. Other: None. CT CERVICAL SPINE FINDINGS Alignment: Within normal limits. Skull base and vertebrae: 7 cervical segments are well visualized. Vertebral body height is well maintained. Multilevel facet hypertrophic changes are seen. Mild osteophytic changes are noted. No acute fracture or acute facet is noted. Soft tissues and spinal canal: Surrounding soft tissue structures are  within normal limits. Upper chest: Visualized lung apices show pleural and parenchymal scarring bilaterally. Other: None IMPRESSION: CT of the head: Chronic atrophic and ischemic changes without acute abnormality. CT of the cervical spine: Multilevel degenerative change without acute abnormality. Electronically Signed   By: Alcide Clever M.D.   On: 04/04/2023 23:12     PROCEDURES:  Critical Care performed: No   CRITICAL CARE Performed by: Baxter Hire Brienne Liguori   Total critical care time: 0 minutes  Critical care time was exclusive of separately billable procedures and treating other patients.  Critical care was necessary to treat or prevent imminent or life-threatening deterioration.  Critical care was time spent personally by me on the following activities: development of treatment plan with patient and/or surrogate as well as nursing,  discussions with consultants, evaluation of patient's response to treatment, examination of patient, obtaining history from patient or surrogate, ordering and performing treatments and interventions, ordering and review of laboratory studies, ordering and review of radiographic studies, pulse oximetry and re-evaluation of patient's condition.   Procedures    IMPRESSION / MDM / ASSESSMENT AND PLAN / ED COURSE  I reviewed the triage vital signs and the nursing notes.  Patient here with fall from nursing home with head injury.     DIFFERENTIAL DIAGNOSIS (includes but not limited to):   Mechanical fall, loss of balance, UTI, anemia, electrolyte derangement, head injury, concussion, skull fracture, intracranial hemorrhage, doubt ACS  Patient's presentation is most consistent with acute presentation with potential threat to life or bodily function.  PLAN: Workup initiated from triage.  Patient has mild anemia with hemoglobin of 11.9.  Normal electrolytes.  Glucose 199.  Urine pending.  CT of the head and cervical spine reviewed and interpreted by myself and the  radiologist and show no acute traumatic injury.  He denies any pain and has no other signs of injury on exam.  Will continue to monitor.   MEDICATIONS GIVEN IN ED: Medications - No data to display   ED COURSE: Urine shows no sign of infection, dehydration.  Will discharge back to the nursing facility.  At this time, I do not feel there is any life-threatening condition present. I reviewed all nursing notes, vitals, pertinent previous records.  All lab and urine results, EKGs, imaging ordered have been independently reviewed and interpreted by myself.  I reviewed all available radiology reports from any imaging ordered this visit.  Based on my assessment, I feel the patient is safe to be discharged home without further emergent workup and can continue workup as an outpatient as needed. Discussed all findings, treatment plan as well as usual and customary return precautions.  They verbalize understanding and are comfortable with this plan.  Outpatient follow-up has been provided as needed.  All questions have been answered.    CONSULTS:  none   OUTSIDE RECORDS REVIEWED: Reviewed last internal medicine visit on 07/29/21.       FINAL CLINICAL IMPRESSION(S) / ED DIAGNOSES   Final diagnoses:  Fall, initial encounter  Injury of head, initial encounter     Rx / DC Orders   ED Discharge Orders     None        Note:  This document was prepared using Dragon voice recognition software and may include unintentional dictation errors.   Charise Leinbach, Layla Maw, DO 04/04/23 2334

## 2023-04-04 NOTE — Discharge Instructions (Addendum)
Your labs, urine were unremarkable.  CT of your head and cervical spine showed no acute traumatic injury.

## 2023-04-04 NOTE — ED Triage Notes (Signed)
Pt in from [facility] via [facility] after trip fall, landing backward onto hardwood floor. No LOC or thinners reported. Staff say baseline he can walk with a walker, but has been weak to BLE's since fall tonight. A&ox4

## 2023-04-14 ENCOUNTER — Other Ambulatory Visit: Payer: Self-pay

## 2023-04-14 ENCOUNTER — Emergency Department
Admission: EM | Admit: 2023-04-14 | Discharge: 2023-04-15 | Disposition: A | Discharge status: Home / Self Care | Payer: Medicare HMO | Attending: Emergency Medicine | Admitting: Emergency Medicine

## 2023-04-14 ENCOUNTER — Emergency Department: Payer: Medicare HMO

## 2023-04-14 ENCOUNTER — Encounter: Payer: Self-pay | Admitting: Emergency Medicine

## 2023-04-14 DIAGNOSIS — W19XXXA Unspecified fall, initial encounter: Secondary | ICD-10-CM

## 2023-04-14 DIAGNOSIS — E119 Type 2 diabetes mellitus without complications: Secondary | ICD-10-CM | POA: Diagnosis not present

## 2023-04-14 DIAGNOSIS — S0990XA Unspecified injury of head, initial encounter: Secondary | ICD-10-CM | POA: Diagnosis present

## 2023-04-14 DIAGNOSIS — W06XXXA Fall from bed, initial encounter: Secondary | ICD-10-CM | POA: Diagnosis not present

## 2023-04-14 DIAGNOSIS — Y92009 Unspecified place in unspecified non-institutional (private) residence as the place of occurrence of the external cause: Secondary | ICD-10-CM | POA: Insufficient documentation

## 2023-04-14 NOTE — ED Notes (Signed)
Patient assisted to the restroom via wheel chair and x1 assist. Then back to room without incident. CB in reach.

## 2023-04-14 NOTE — ED Provider Notes (Signed)
[facility] Emergency Department Provider Note     Event Date/Time   First MD Initiated Contact with Patient 04/14/23 2201     (approximate)   History   Fall   HPI  Troy Preston is a 89 y.o. male with a history of BPH, hypogonadism, DM, and decreased hearing, presents via EMS from [facility]  Patient reports he was sitting on side of his bed when he apparently slipped down to the floor but hit his head on the wall.  Denies any LOC or dizziness.  He does not take any blood thinners and denies any significant injury or pain.  Patient just request to be checked out.  No other injuries reported at this time.  Physical Exam   Triage Vital Signs: ED Triage Vitals  Encounter Vitals Group     BP 04/14/23 2120 (!) 181/74     Systolic BP Percentile --      Diastolic BP Percentile --      Pulse Rate 04/14/23 2120 71     Resp 04/14/23 2120 18     Temp 04/14/23 2120 98.2 F (36.8 C)     Temp Source 04/14/23 2120 Oral     SpO2 04/14/23 2120 98 %     Weight 04/14/23 2120 187 lb (84.8 kg)     Height 04/14/23 2120 5\' 9"  (1.753 m)     Head Circumference --      Peak Flow --      Pain Score 04/14/23 2128 0     Pain Loc --      Pain Education --      Exclude from Growth Chart --     Most recent vital signs: Vitals:   04/14/23 2120  BP: (!) 181/74  Pulse: 71  Resp: 18  Temp: 98.2 F (36.8 C)  SpO2: 98%    General Awake, no distress. NAD HEENT NCAT. PERRL. EOMI. No rhinorrhea. Mucous membranes are moist.  CV:  Good peripheral perfusion.  RESP:  Normal effort.  ABD:  No distention.  MSK:  Normal active range of motion of upper lower extremities.  Patient transitions from sit to stand with standby assistance.   ED Results / Procedures / Treatments   Labs (all labs ordered are listed, but only abnormal results are displayed) Labs Reviewed - No data to display   EKG  RADIOLOGY  I personally viewed and evaluated these images as part of my  medical decision making, as well as reviewing the written report by the radiologist.  ED Provider Interpretation: No acute findings  CT Head / Cervical Spine w/o CM  IMPRESSION: 1. No CT evidence for acute intracranial abnormality. Atrophy and chronic small vessel ischemic changes of the white matter. 2. Degenerative changes of the cervical spine. No acute osseous abnormality. PROCEDURES:  Critical Care performed: No  Procedures   MEDICATIONS ORDERED IN ED: Medications - No data to display   IMPRESSION / MDM / ASSESSMENT AND PLAN / ED COURSE  I reviewed the triage vital signs and the nursing notes.                              Differential diagnosis includes, but is not limited to, SDH, skull fracture, cervical fracture, cervical dislocation, cervical neuropathy, myalgias  Patient's presentation is most consistent with acute complicated illness / injury requiring diagnostic workup.  Patient's diagnosis is consistent with fall at home with minor head  injury.  Patient with reassuring exam and workup at this time with no acute neuromuscular deficits.  No CT evidence of any acute closed head injury, SDH, or cervical spine fracture with some interpretation of images.  Patient will be discharged home with instructions to take his home medications as prescribed. Patient is to follow up with primary provider as discussed, as needed or otherwise directed. Patient is given ED precautions to return to the ED for any worsening or new symptoms.  FINAL CLINICAL IMPRESSION(S) / ED DIAGNOSES   Final diagnoses:  Fall in home, initial encounter  Minor head injury, initial encounter     Rx / DC Orders   ED Discharge Orders     None        Note:  This document was prepared using Dragon voice recognition software and may include unintentional dictation errors.    Lissa Hoard, PA-C 04/15/23 2330    Trinna Post, MD 04/20/23 0700

## 2023-04-14 NOTE — ED Triage Notes (Signed)
Pt presents ambulatory to triage via [facility] from [facility] with no complaints following a fall tonight. Pt was sitting on the side of the bed and slipped onto the floor hitting his head on the wall. No LOC nor dizziness. Pt states he just wants to be checked out. A&Ox4 at this time. Denies CP or SOB.

## 2023-10-07 ENCOUNTER — Other Ambulatory Visit: Payer: Self-pay

## 2023-10-07 ENCOUNTER — Inpatient Hospital Stay
Admission: EM | Admit: 2023-10-07 | Discharge: 2023-10-14 | DRG: 521 | Disposition: A | Discharge status: Skilled Nursing Facility | Attending: Internal Medicine | Admitting: Internal Medicine

## 2023-10-07 ENCOUNTER — Emergency Department

## 2023-10-07 DIAGNOSIS — R296 Repeated falls: Secondary | ICD-10-CM | POA: Diagnosis present

## 2023-10-07 DIAGNOSIS — N138 Other obstructive and reflux uropathy: Secondary | ICD-10-CM | POA: Diagnosis present

## 2023-10-07 DIAGNOSIS — Z79890 Hormone replacement therapy (postmenopausal): Secondary | ICD-10-CM | POA: Diagnosis not present

## 2023-10-07 DIAGNOSIS — Z974 Presence of external hearing-aid: Secondary | ICD-10-CM | POA: Diagnosis not present

## 2023-10-07 DIAGNOSIS — H9190 Unspecified hearing loss, unspecified ear: Secondary | ICD-10-CM | POA: Diagnosis not present

## 2023-10-07 DIAGNOSIS — Y92099 Unspecified place in other non-institutional residence as the place of occurrence of the external cause: Secondary | ICD-10-CM | POA: Diagnosis not present

## 2023-10-07 DIAGNOSIS — Z8249 Family history of ischemic heart disease and other diseases of the circulatory system: Secondary | ICD-10-CM | POA: Diagnosis not present

## 2023-10-07 DIAGNOSIS — Z79899 Other long term (current) drug therapy: Secondary | ICD-10-CM

## 2023-10-07 DIAGNOSIS — S72002A Fracture of unspecified part of neck of left femur, initial encounter for closed fracture: Principal | ICD-10-CM

## 2023-10-07 DIAGNOSIS — K219 Gastro-esophageal reflux disease without esophagitis: Secondary | ICD-10-CM | POA: Diagnosis present

## 2023-10-07 DIAGNOSIS — N401 Enlarged prostate with lower urinary tract symptoms: Secondary | ICD-10-CM | POA: Diagnosis present

## 2023-10-07 DIAGNOSIS — Z87891 Personal history of nicotine dependence: Secondary | ICD-10-CM

## 2023-10-07 DIAGNOSIS — L299 Pruritus, unspecified: Secondary | ICD-10-CM | POA: Diagnosis present

## 2023-10-07 DIAGNOSIS — S72002S Fracture of unspecified part of neck of left femur, sequela: Secondary | ICD-10-CM

## 2023-10-07 DIAGNOSIS — W010XXA Fall on same level from slipping, tripping and stumbling without subsequent striking against object, initial encounter: Secondary | ICD-10-CM | POA: Diagnosis present

## 2023-10-07 DIAGNOSIS — S72009A Fracture of unspecified part of neck of unspecified femur, initial encounter for closed fracture: Secondary | ICD-10-CM | POA: Diagnosis present

## 2023-10-07 DIAGNOSIS — Z604 Social exclusion and rejection: Secondary | ICD-10-CM | POA: Diagnosis present

## 2023-10-07 DIAGNOSIS — E291 Testicular hypofunction: Secondary | ICD-10-CM | POA: Diagnosis present

## 2023-10-07 DIAGNOSIS — G20A1 Parkinson's disease without dyskinesia, without mention of fluctuations: Secondary | ICD-10-CM | POA: Diagnosis present

## 2023-10-07 DIAGNOSIS — E1165 Type 2 diabetes mellitus with hyperglycemia: Secondary | ICD-10-CM | POA: Diagnosis present

## 2023-10-07 DIAGNOSIS — E871 Hypo-osmolality and hyponatremia: Secondary | ICD-10-CM | POA: Diagnosis not present

## 2023-10-07 DIAGNOSIS — Z8719 Personal history of other diseases of the digestive system: Secondary | ICD-10-CM

## 2023-10-07 DIAGNOSIS — Z66 Do not resuscitate: Secondary | ICD-10-CM | POA: Diagnosis present

## 2023-10-07 DIAGNOSIS — Z7984 Long term (current) use of oral hypoglycemic drugs: Secondary | ICD-10-CM | POA: Diagnosis not present

## 2023-10-07 DIAGNOSIS — S72012A Unspecified intracapsular fracture of left femur, initial encounter for closed fracture: Secondary | ICD-10-CM | POA: Diagnosis present

## 2023-10-07 DIAGNOSIS — F0390 Unspecified dementia without behavioral disturbance: Secondary | ICD-10-CM | POA: Diagnosis present

## 2023-10-07 DIAGNOSIS — Z833 Family history of diabetes mellitus: Secondary | ICD-10-CM | POA: Diagnosis not present

## 2023-10-07 DIAGNOSIS — D62 Acute posthemorrhagic anemia: Secondary | ICD-10-CM | POA: Diagnosis not present

## 2023-10-07 DIAGNOSIS — E43 Unspecified severe protein-calorie malnutrition: Secondary | ICD-10-CM | POA: Diagnosis present

## 2023-10-07 DIAGNOSIS — Z01810 Encounter for preprocedural cardiovascular examination: Secondary | ICD-10-CM | POA: Diagnosis not present

## 2023-10-07 DIAGNOSIS — Z6820 Body mass index (BMI) 20.0-20.9, adult: Secondary | ICD-10-CM | POA: Diagnosis not present

## 2023-10-07 DIAGNOSIS — Y92009 Unspecified place in unspecified non-institutional (private) residence as the place of occurrence of the external cause: Secondary | ICD-10-CM

## 2023-10-07 LAB — CBC
HCT: 35.2 % — ABNORMAL LOW (ref 39.0–52.0)
Hemoglobin: 11.2 g/dL — ABNORMAL LOW (ref 13.0–17.0)
MCH: 30.3 pg (ref 26.0–34.0)
MCHC: 31.8 g/dL (ref 30.0–36.0)
MCV: 95.1 fL (ref 80.0–100.0)
Platelets: 248 10*3/uL (ref 150–400)
RBC: 3.7 MIL/uL — ABNORMAL LOW (ref 4.22–5.81)
RDW: 13.2 % (ref 11.5–15.5)
WBC: 10.8 10*3/uL — ABNORMAL HIGH (ref 4.0–10.5)
nRBC: 0 % (ref 0.0–0.2)

## 2023-10-07 LAB — BASIC METABOLIC PANEL
Anion gap: 11 (ref 5–15)
BUN: 21 mg/dL (ref 8–23)
CO2: 21 mmol/L — ABNORMAL LOW (ref 22–32)
Calcium: 8.7 mg/dL — ABNORMAL LOW (ref 8.9–10.3)
Chloride: 104 mmol/L (ref 98–111)
Creatinine, Ser: 1.2 mg/dL (ref 0.61–1.24)
GFR, Estimated: 59 mL/min — ABNORMAL LOW (ref >60)
Glucose, Bld: 314 mg/dL — ABNORMAL HIGH (ref 70–99)
Potassium: 3.7 mmol/L (ref 3.5–5.1)
Sodium: 136 mmol/L (ref 135–145)

## 2023-10-07 LAB — URINALYSIS, ROUTINE W REFLEX MICROSCOPIC
Bacteria, UA: NONE SEEN
Bilirubin Urine: NEGATIVE
Glucose, UA: >=500 mg/dL — AB
Hgb urine dipstick: NEGATIVE
Ketones, ur: NEGATIVE mg/dL
Leukocytes,Ua: NEGATIVE
Nitrite: NEGATIVE
Protein, ur: NEGATIVE mg/dL
Specific Gravity, Urine: 1.028 (ref 1.005–1.030)
Squamous Epithelial / HPF: 0 /HPF (ref 0–5)
pH: 6 (ref 5.0–8.0)

## 2023-10-07 LAB — TYPE AND SCREEN
ABO/RH(D): O NEG
Antibody Screen: NEGATIVE

## 2023-10-07 LAB — GLUCOSE, CAPILLARY: Glucose-Capillary: 258 mg/dL — ABNORMAL HIGH (ref 70–99)

## 2023-10-07 LAB — PROTIME-INR
INR: 1.2 (ref 0.8–1.2)
Prothrombin Time: 15.4 s — ABNORMAL HIGH (ref 11.4–15.2)

## 2023-10-07 LAB — HEMOGLOBIN A1C
Hgb A1c MFr Bld: 6.6 % — ABNORMAL HIGH (ref 4.8–5.6)
Mean Plasma Glucose: 142.72 mg/dL

## 2023-10-07 MED ORDER — HYDROCODONE-ACETAMINOPHEN 5-325 MG PO TABS: 1.0000 | ORAL_TABLET | ORAL | Status: DC | PRN

## 2023-10-07 MED ORDER — METOPROLOL TARTRATE 25 MG PO TABS
12.5000 mg | ORAL_TABLET | Freq: Two times a day (BID) | ORAL | Status: DC
  Administered 2023-10-08: 12.5 mg via ORAL
  Filled 2023-10-07: qty 1

## 2023-10-07 MED ORDER — TRANEXAMIC ACID-NACL 1000-0.7 MG/100ML-% IV SOLN
1000.0000 mg | INTRAVENOUS | Status: AC
  Administered 2023-10-08: 1000 mg via INTRAVENOUS
  Filled 2023-10-07: qty 100

## 2023-10-07 MED ORDER — GABAPENTIN 100 MG PO CAPS
200.0000 mg | ORAL_CAPSULE | Freq: Two times a day (BID) | ORAL | Status: DC
  Administered 2023-10-07 – 2023-10-14 (×13): 200 mg via ORAL
  Filled 2023-10-07 (×13): qty 2

## 2023-10-07 MED ORDER — INSULIN ASPART 100 UNIT/ML IJ SOLN
0.0000 [IU] | Freq: Every day | INTRAMUSCULAR | Status: DC
  Administered 2023-10-07: 3 [IU] via SUBCUTANEOUS
  Administered 2023-10-08 – 2023-10-13 (×2): 2 [IU] via SUBCUTANEOUS
  Filled 2023-10-07 (×3): qty 1

## 2023-10-07 MED ORDER — INSULIN ASPART 100 UNIT/ML IJ SOLN
0.0000 [IU] | Freq: Three times a day (TID) | INTRAMUSCULAR | Status: DC
  Administered 2023-10-08: 5 [IU] via SUBCUTANEOUS
  Administered 2023-10-08: 2 [IU] via SUBCUTANEOUS
  Administered 2023-10-09 (×2): 5 [IU] via SUBCUTANEOUS
  Administered 2023-10-10 (×3): 3 [IU] via SUBCUTANEOUS
  Administered 2023-10-11: 5 [IU] via SUBCUTANEOUS
  Administered 2023-10-11 – 2023-10-12 (×3): 3 [IU] via SUBCUTANEOUS
  Administered 2023-10-12 (×2): 5 [IU] via SUBCUTANEOUS
  Administered 2023-10-13: 3 [IU] via SUBCUTANEOUS
  Administered 2023-10-13: 5 [IU] via SUBCUTANEOUS
  Administered 2023-10-13: 3 [IU] via SUBCUTANEOUS
  Administered 2023-10-14: 5 [IU] via SUBCUTANEOUS
  Filled 2023-10-07 (×18): qty 1

## 2023-10-07 MED ORDER — CEFAZOLIN SODIUM-DEXTROSE 2-4 GM/100ML-% IV SOLN
2.0000 g | INTRAVENOUS | Status: AC
  Administered 2023-10-08: 2 g via INTRAVENOUS
  Filled 2023-10-07: qty 100

## 2023-10-07 MED ORDER — ASPIRIN 81 MG PO TBEC: 81.0000 mg | DELAYED_RELEASE_TABLET | Freq: Every day | ORAL | Status: DC

## 2023-10-07 MED ORDER — PANTOPRAZOLE SODIUM 40 MG PO TBEC
40.0000 mg | DELAYED_RELEASE_TABLET | Freq: Every day | ORAL | Status: DC
  Administered 2023-10-09 – 2023-10-14 (×6): 40 mg via ORAL
  Filled 2023-10-07 (×6): qty 1

## 2023-10-07 MED ORDER — MORPHINE SULFATE (PF) 2 MG/ML IV SOLN: 1.0000 mg | INTRAVENOUS | Status: DC | PRN

## 2023-10-07 MED ORDER — FENTANYL CITRATE PF 50 MCG/ML IJ SOSY
25.0000 ug | PREFILLED_SYRINGE | Freq: Once | INTRAMUSCULAR | Status: AC
  Administered 2023-10-07: 25 ug via INTRAVENOUS
  Filled 2023-10-07: qty 1

## 2023-10-07 MED ORDER — SODIUM CHLORIDE 0.9 % IV SOLN: INTRAVENOUS | Status: DC

## 2023-10-07 MED ORDER — CHLORHEXIDINE GLUCONATE 4 % EX SOLN: Freq: Once | CUTANEOUS | Status: AC

## 2023-10-07 MED ORDER — ACETAMINOPHEN 325 MG PO TABS
650.0000 mg | ORAL_TABLET | Freq: Four times a day (QID) | ORAL | Status: DC | PRN
  Filled 2023-10-07: qty 2

## 2023-10-07 MED ORDER — FENTANYL CITRATE PF 50 MCG/ML IJ SOSY: 25.0000 ug | PREFILLED_SYRINGE | Freq: Once | INTRAMUSCULAR | Status: DC | PRN

## 2023-10-07 MED ORDER — CELECOXIB 200 MG PO CAPS
200.0000 mg | ORAL_CAPSULE | Freq: Two times a day (BID) | ORAL | Status: AC
  Administered 2023-10-07 – 2023-10-09 (×3): 200 mg via ORAL
  Filled 2023-10-07 (×4): qty 1

## 2023-10-07 MED ORDER — BISACODYL 5 MG PO TBEC: 5.0000 mg | DELAYED_RELEASE_TABLET | Freq: Every day | ORAL | Status: DC | PRN

## 2023-10-07 MED ORDER — ENOXAPARIN SODIUM 40 MG/0.4ML IJ SOSY: 40.0000 mg | PREFILLED_SYRINGE | INTRAMUSCULAR | Status: DC

## 2023-10-07 NOTE — ED Triage Notes (Signed)
 Pt to ED [facility] From [facility] for fall yesterday, reports unsure what caused fall. Reports left hip and upper left leg pain, states unable to bear weight.  +hit head, denies LOC, denies blood thinner use.

## 2023-10-07 NOTE — ED Provider Notes (Signed)
 [facility] Provider Note    Event Date/Time   First MD Initiated Contact with Patient 10/07/23 1506     (approximate)   History   Fall   HPI  Troy Preston is a 89 year old male presenting to the emergency department for evaluation after a fall.  Patient was walking when he reports he turned around too quickly causing him to lose his balance and fall onto his left hip. Denies hitting his head to me, but did report head strike in triage.  Not on anticoagulation.  Denies LOC.  Was unable to stand up after falling yesterday.     Physical Exam   Triage Vital Signs: ED Triage Vitals  Encounter Vitals Group     BP 10/07/23 1356 111/62     Systolic BP Percentile --      Diastolic BP Percentile --      Pulse Rate 10/07/23 1356 83     Resp 10/07/23 1356 18     Temp 10/07/23 1356 98 F (36.7 C)     Temp src --      SpO2 10/07/23 1356 100 %     Weight 10/07/23 1357 140 lb (63.5 kg)     Height 10/07/23 1357 5\' 9"  (1.753 m)     Head Circumference --      Peak Flow --      Pain Score 10/07/23 1356 7     Pain Loc --      Pain Education --      Exclude from Growth Chart --     Most recent vital signs: Vitals:   10/07/23 1356  BP: 111/62  Pulse: 83  Resp: 18  Temp: 98 F (36.7 C)  SpO2: 100%    Nursing notes and vital signs reviewed.  General: Adult male, laying in bed, appears uncomfortable Head: Atraumatic Chest: Symmetric chest rise, no tenderness to palpation.  Cardiac: Regular rhythm and rate.  Respiratory: Lungs clear to auscultation Abdomen: Soft, nondistended. No tenderness to palpation.  Pelvis: Stable in AP and lateral compression. No tenderness to palpation. MSK: No deformity bilateral upper and right lower extremity.  No visible deformity of the left lower extremity, but is tender over the proximal femur with pain with attempted range of motion of the extremity.  Intact DP pulse. Neuro: Alert, oriented. Normal sensation to light  touch in bilateral upper and lower extremity. Skin: No evidence of burns or lacerations.  ED Results / Procedures / Treatments   Labs (all labs ordered are listed, but only abnormal results are displayed) Labs Reviewed  CBC - Abnormal; Notable for the following components:      Result Value   WBC 10.8 (*)    RBC 3.70 (*)    Hemoglobin 11.2 (*)    HCT 35.2 (*)    All other components within normal limits  BASIC METABOLIC PANEL - Abnormal; Notable for the following components:   CO2 21 (*)    Glucose, Bld 314 (*)    Calcium 8.7 (*)    GFR, Estimated 59 (*)    All other components within normal limits     EKG EKG independently reviewed interpreted by myself (ER attending) demonstrates:    RADIOLOGY Imaging independently reviewed and interpreted by myself demonstrates:  CXR without focal consolidation CT head without acute bleed, radiology notes possible sinusitis, but patient clinically without symptoms at this CT C-spine without acute fracture Hip x-Tyra Michelle concerning for left femoral neck fracture  PROCEDURES:  Critical Care  performed: No  Procedures   MEDICATIONS ORDERED IN ED: Medications  fentaNYL (SUBLIMAZE) injection 25 mcg (has no administration in time range)  fentaNYL (SUBLIMAZE) injection 25 mcg (25 mcg Intravenous Given 10/07/23 1541)     IMPRESSION / MDM / ASSESSMENT AND PLAN / ED COURSE  I reviewed the triage vital signs and the nursing notes.  Differential diagnosis includes, but is not limited to, intermittently bleed, spine fracture, notice of thoracoabdominal trauma, hip fracture, dislocation, soft tissue injury  Patient's presentation is most consistent with acute presentation with potential threat to life or bodily function.  89 year old male presenting after ground-level fall with left hip pain and inability to bear weight.  X-Vera Wishart demonstrates left femoral neck fracture, no other traumatic injuries noted.  Patient's son updated via phone,  comfortable with plan for admission, surgery as indicated.  Case reviewed with Dr. Hyacinth Meeker with orthopedics.  He will evaluate the patient, plan for operative intervention in the morning.  Will reach out to hospitalist team for admission.  4:33 PM Case discussed Dr. Chipper Herb.  He will evaluate the patient for anticipated admission.      FINAL CLINICAL IMPRESSION(S) / ED DIAGNOSES   Final diagnoses:  Closed fracture of neck of left femur, initial encounter (HCC)  Fall in home, initial encounter     Rx / DC Orders   ED Discharge Orders     None        Note:  This document was prepared using Dragon voice recognition software and may include unintentional dictation errors.   Trinna Post, MD 10/07/23 [phone]

## 2023-10-07 NOTE — H&P (Signed)
 History and Physical    Troy Preston:[id number] DOB: 11-05-1933 DOA: 10/07/2023  PCP: Barbette Reichmann, MD (Confirm with patient/family/NH records and if not entered, this has to be entered at [facility] point of entry) Patient coming from: SNF  I have personally briefly reviewed patient's old medical records in [facility]  Chief Complaint: Larey Seat and broke my hip  HPI: Troy Preston is a 89 y.o. male with medical history significant of IIDM, dementia, questionable Parkinson's disease, GERD, sent from nursing home for evaluation of hip pain after fall.  Patient has dementia and does not remember much what had happened.  Most history provided by son over the phone.  Son/POA reported that patient has chronic ambulation impairment uses roller walker to ambulate despite he has been having increasing bilateral lower extremity weakness and shakiness, his PCP has been concerned about new onset of Parkinson's and the patient has had multiple fall before today's episode.  Patient has been using testosterone cream rubbing daily on both legs with minimal improvement of bilateral lower extremity weakness.  At baseline patient appears to have fair exercise tolerance, can usually walk 10 minutes without chest pain or shortness of breath.  No history of CAD, no complaining of chest pain or lightheadedness before today.  Appears that the patient fell yesterday in the facility and woke up this morning complaining about severe pain of the left hip and unable to put weight on. ED Course: Afebrile, not tachycardia blood pressure elevated 150/76.  CT head and neck negative for fracture or dislocation.  X-ray of the hip showed dislocated femoral neck fracture on the left side.  Blood work showed glucose 314, hemoglobin 12, creatinine 1.2, BUN 21.  Review of Systems: As per HPI otherwise 14 point review of systems negative.    Past Medical History:  Diagnosis Date   Constipation    Diabetes mellitus without  complication (HCC)    Esophagitis    HOH (hard of hearing)    Impaired glucose tolerance    Tinnitus    Urinary frequency    Wears hearing aid in both ears     Past Surgical History:  Procedure Laterality Date   CATARACT EXTRACTION W/PHACO Right 07/07/2021   Procedure: CATARACT EXTRACTION PHACO AND INTRAOCULAR LENS PLACEMENT (IOC) RIGHT DIABETIC 24.31 01:55.6;  Surgeon: Galen Manila, MD;  Location: [facility];  Service: Ophthalmology;  Laterality: Right;  Diabetic   CATARACT EXTRACTION W/PHACO Left 07/28/2021   Procedure: CATARACT EXTRACTION PHACO AND INTRAOCULAR LENS PLACEMENT (IOC) LEFT DIABETIC 25.13 02:01.6;  Surgeon: Galen Manila, MD;  Location: [facility];  Service: Ophthalmology;  Laterality: Left;  Diabetic   Palmer fascietomy and release of Dupuytren's contracture     right little finger, left ring and little fingers     reports that he has quit smoking. His smoking use included cigarettes. His smokeless tobacco use includes chew. He reports that he does not drink alcohol. No history on file for drug use.  No Known Allergies  Family History  Problem Relation Age of Onset   Cancer Other    Diabetes Mellitus II Mother    Heart attack Father      Prior to Admission medications   Medication Sig Start Date End Date Taking? Authorizing Provider  acetaminophen (TYLENOL) 325 MG tablet Take 650 mg by mouth every 6 (six) hours as needed.    [provider]  ASPIRIN 81 PO Take by mouth daily.    [provider]  bisacodyl (DULCOLAX)  5 MG EC tablet Take 5 mg by mouth.    [provider]  Cinnamon 500 MG capsule Take 500 mg by mouth.    [provider]  ibuprofen (ADVIL,MOTRIN) 100 MG tablet Take 100 mg by mouth every 6 (six) hours as needed for fever.    [provider]  LORazepam (ATIVAN) 1 MG tablet take 1 pill 60 min before MRI and 2nd pill 15 min before MRI if needed. Patient should not drive when under  influence of medicine. Patient not taking: Reported on 06/30/2021 11/06/14   [provider]  metFORMIN (GLUCOPHAGE) 1000 MG tablet Take 1,000 mg by mouth daily with breakfast.    [provider]  omeprazole (PRILOSEC) 20 MG capsule Take 20 mg by mouth.    [provider]  Testosterone (ANDROGEL) 20.25 MG/1.25GM (1.62%) GEL Apply 20 each topically daily. Patient needs to apply 2 pumps daily 12/26/14   Michiel Cowboy A, PA-C  vitamin B-12 (CYANOCOBALAMIN) 500 MCG tablet Take by mouth.    [provider]    Physical Exam: Vitals:   10/07/23 1356 10/07/23 1357 10/07/23 1630 10/07/23 1700  BP: 111/62  (!) 169/63 (!) 152/76  Pulse: 83  72 73  Resp: 18  (!) 24 (!) 27  Temp: 98 F (36.7 C)     SpO2: 100%     Weight:  63.5 kg    Height:  5\' 9"  (1.753 m)      Constitutional: NAD, calm, comfortable Vitals:   10/07/23 1356 10/07/23 1357 10/07/23 1630 10/07/23 1700  BP: 111/62  (!) 169/63 (!) 152/76  Pulse: 83  72 73  Resp: 18  (!) 24 (!) 27  Temp: 98 F (36.7 C)     SpO2: 100%     Weight:  63.5 kg    Height:  5\' 9"  (1.753 m)     Eyes: PERRL, lids and conjunctivae normal ENMT: Mucous membranes are moist. Posterior pharynx clear of any exudate or lesions.Normal dentition.  Neck: normal, supple, no masses, no thyromegaly Respiratory: clear to auscultation bilaterally, no wheezing, no crackles. Normal respiratory effort. No accessory muscle use.  Cardiovascular: Regular rate and rhythm, no murmurs / rubs / gallops. No extremity edema. 2+ pedal pulses. No carotid bruits.  Abdomen: no tenderness, no masses palpated. No hepatosplenomegaly. Bowel sounds positive.  Musculoskeletal: Left leg shortened and rotated Skin: no rashes, lesions, ulcers. No induration Neurologic: CN 2-12 grossly intact. Sensation intact, DTR normal. Strength 5/5 in all 4.  Psychiatric: Normal judgment and insight. Alert and oriented x 3. Normal mood.    Labs on Admission: I have  personally reviewed following labs and imaging studies  CBC: Recent Labs  Lab 10/07/23 1359  WBC 10.8*  HGB 11.2*  HCT 35.2*  MCV 95.1  PLT 248   Basic Metabolic Panel: Recent Labs  Lab 10/07/23 1359  NA 136  K 3.7  CL 104  CO2 21*  GLUCOSE 314*  BUN 21  CREATININE 1.20  CALCIUM 8.7*   GFR: Estimated Creatinine Clearance: 39 mL/min (by C-G formula based on SCr of 1.2 mg/dL). Liver Function Tests: No results for input(s): "AST", "ALT", "ALKPHOS", "BILITOT", "PROT", "ALBUMIN" in the last 168 hours. No results for input(s): "LIPASE", "AMYLASE" in the last 168 hours. No results for input(s): "AMMONIA" in the last 168 hours. Coagulation Profile: No results for input(s): "INR", "PROTIME" in the last 168 hours. Cardiac Enzymes: No results for input(s): "CKTOTAL", "CKMB", "CKMBINDEX", "TROPONINI" in the last 168 hours. BNP (last 3  results) No results for input(s): "PROBNP" in the last 8760 hours. HbA1C: No results for input(s): "HGBA1C" in the last 72 hours. CBG: No results for input(s): "GLUCAP" in the last 168 hours. Lipid Profile: No results for input(s): "CHOL", "HDL", "LDLCALC", "TRIG", "CHOLHDL", "LDLDIRECT" in the last 72 hours. Thyroid Function Tests: No results for input(s): "TSH", "T4TOTAL", "FREET4", "T3FREE", "THYROIDAB" in the last 72 hours. Anemia Panel: No results for input(s): "VITAMINB12", "FOLATE", "FERRITIN", "TIBC", "IRON", "RETICCTPCT" in the last 72 hours. Urine analysis:    Component Value Date/Time   COLORURINE YELLOW (A) 04/04/2023 2305   APPEARANCEUR CLEAR (A) 04/04/2023 2305   LABSPEC 1.020 04/04/2023 2305   PHURINE 8.0 04/04/2023 2305   GLUCOSEU NEGATIVE 04/04/2023 2305   HGBUR NEGATIVE 04/04/2023 2305   BILIRUBINUR NEGATIVE 04/04/2023 2305   KETONESUR 5 (A) 04/04/2023 2305   PROTEINUR 30 (A) 04/04/2023 2305   NITRITE NEGATIVE 04/04/2023 2305   LEUKOCYTESUR NEGATIVE 04/04/2023 2305    Radiological Exams on Admission: DG Chest  Portable 1 View Result Date: 10/07/2023 CLINICAL DATA:  Status post fall, left hip fracture. EXAM: PORTABLE CHEST 1 VIEW COMPARISON:  None available. FINDINGS: Lung volumes are low. Upper normal heart size. Mild aortic atherosclerosis and tortuosity. No focal airspace disease, pulmonary edema, pleural effusion or pneumothorax. No displaced rib fracture. IMPRESSION: Low lung volumes without acute chest findings. Electronically Signed   By: Narda Rutherford M.D.   On: 10/07/2023 15:57   DG HIP UNILAT WITH PELVIS 2-3 VIEWS LEFT Result Date: 10/07/2023 CLINICAL DATA:  Fall yesterday with left hip pain. EXAM: DG HIP (WITH OR WITHOUT PELVIS) 2-3V LEFT COMPARISON:  None Available. FINDINGS: Displaced left femoral neck fracture. Slight proximal migration of the femoral shaft. Apex anterior angulation. The femoral head remains seated. Bony pelvis is intact including pubic rami. No pubic symphyseal or sacroiliac diastasis. IMPRESSION: Displaced left femoral neck fracture. Electronically Signed   By: Narda Rutherford M.D.   On: 10/07/2023 15:55   CT HEAD WO CONTRAST ( ) Result Date: 10/07/2023 CLINICAL DATA:  Head trauma, minor (Age >= 65y); Neck trauma (Age >= 65y). Fall yesterday with head strike. EXAM: CT HEAD WITHOUT CONTRAST CT CERVICAL SPINE WITHOUT CONTRAST TECHNIQUE: Multidetector CT imaging of the head and cervical spine was performed following the standard protocol without intravenous contrast. Multiplanar CT image reconstructions of the cervical spine were also generated. RADIATION DOSE REDUCTION: This exam was performed according to the departmental dose-optimization program which includes automated exposure control, adjustment of the mA and/or kV according to patient size and/or use of iterative reconstruction technique. COMPARISON:  CT head and cervical spine 04/14/2023 FINDINGS: CT HEAD FINDINGS Brain: There is no evidence of an acute infarct, intracranial hemorrhage, mass, midline shift, or extra-axial  fluid collection. There is unchanged mild cerebral atrophy. Cerebral white matter hypodensities are similar to the prior CT and are nonspecific but compatible with mild chronic small vessel ischemic disease. Vascular: No hyperdense vessel. Skull: No acute fracture or suspicious lesion. Sinuses/Orbits: Chronic posterior left ethmoid air cell opacification. New small to moderate volume fluid in the left sphenoid and right maxillary sinuses with mild mucosal thickening in the latter as well. Increased, large volume fluid and mucosal thickening resulting in near complete opacification of the left maxillary sinus. Clear mastoid air cells. Bilateral cataract extraction. Other: None. CT CERVICAL SPINE FINDINGS Alignment: Normal. Skull base and vertebrae: No acute fracture or suspicious lesion. Soft tissues and spinal canal: No prevertebral fluid or swelling. No visible canal hematoma. Disc levels: No significant disc  degeneration for age. Moderately advanced facet arthrosis in the mid and upper cervical spine, overall worse on the right. Upper chest: Biapical lung scarring. Other: None. IMPRESSION: 1. No evidence of acute intracranial abnormality or cervical spine fracture. 2. Mild chronic small vessel ischemic disease. 3. Sinusitis, including near complete opacification of the left maxillary sinus by fluid. Correlate for acute sinusitis. Electronically Signed   By: Sebastian Ache M.D.   On: 10/07/2023 15:15   CT Cervical Spine Wo Contrast Result Date: 10/07/2023 CLINICAL DATA:  Head trauma, minor (Age >= 65y); Neck trauma (Age >= 65y). Fall yesterday with head strike. EXAM: CT HEAD WITHOUT CONTRAST CT CERVICAL SPINE WITHOUT CONTRAST TECHNIQUE: Multidetector CT imaging of the head and cervical spine was performed following the standard protocol without intravenous contrast. Multiplanar CT image reconstructions of the cervical spine were also generated. RADIATION DOSE REDUCTION: This exam was performed according to the  departmental dose-optimization program which includes automated exposure control, adjustment of the mA and/or kV according to patient size and/or use of iterative reconstruction technique. COMPARISON:  CT head and cervical spine 04/14/2023 FINDINGS: CT HEAD FINDINGS Brain: There is no evidence of an acute infarct, intracranial hemorrhage, mass, midline shift, or extra-axial fluid collection. There is unchanged mild cerebral atrophy. Cerebral white matter hypodensities are similar to the prior CT and are nonspecific but compatible with mild chronic small vessel ischemic disease. Vascular: No hyperdense vessel. Skull: No acute fracture or suspicious lesion. Sinuses/Orbits: Chronic posterior left ethmoid air cell opacification. New small to moderate volume fluid in the left sphenoid and right maxillary sinuses with mild mucosal thickening in the latter as well. Increased, large volume fluid and mucosal thickening resulting in near complete opacification of the left maxillary sinus. Clear mastoid air cells. Bilateral cataract extraction. Other: None. CT CERVICAL SPINE FINDINGS Alignment: Normal. Skull base and vertebrae: No acute fracture or suspicious lesion. Soft tissues and spinal canal: No prevertebral fluid or swelling. No visible canal hematoma. Disc levels: No significant disc degeneration for age. Moderately advanced facet arthrosis in the mid and upper cervical spine, overall worse on the right. Upper chest: Biapical lung scarring. Other: None. IMPRESSION: 1. No evidence of acute intracranial abnormality or cervical spine fracture. 2. Mild chronic small vessel ischemic disease. 3. Sinusitis, including near complete opacification of the left maxillary sinus by fluid. Correlate for acute sinusitis. Electronically Signed   By: Sebastian Ache M.D.   On: 10/07/2023 15:15    EKG: Independently reviewed.  Sinus rhythm, chronic RBBB and left fascicular block, chronic ST changes as compared to  before.  Assessment/Plan Principal Problem:   Hip fracture (HCC) Active Problems:   Femoral neck fracture (HCC)  (please populate well all problems here in Problem List. (For example, if patient is on BP meds at home and you resume or decide to hold them, it is a problem that needs to be her. Same for CAD, COPD, HLD and so on)  Left femoral neck fracture -ORIF tomorrow -Appears that patient at baseline able to tolerate 4 METS activity and calculated revised perioperative major cardiac risk 3.9%, medically cleared for tomorrow's ORIF with general anesthesia with acceptable cardiac risk. -Will order echo for further evaluate -Continue aspirin -Start preop beta-blocker  IIDM, hyperglycemia -Likely secondary to stress -Patient's son reported the patient glucose has been fairly controlled with a single metformin -Hold off metformin, start SSI    DVT prophylaxis: Lovenox Code Status: DNR Family Communication: Son over the phone Disposition Plan: Patient is sick with femoral neck  fracture requiring ORIF, expect more than 2 midnight hospital stay Consults called: Orthopedic surgery Admission status: MedSurg admission   Emeline General MD [facility] Pager [phone]  10/07/2023, 5:03 PM

## 2023-10-08 ENCOUNTER — Other Ambulatory Visit: Payer: Self-pay

## 2023-10-08 ENCOUNTER — Inpatient Hospital Stay: Payer: Self-pay | Admitting: Anesthesiology

## 2023-10-08 ENCOUNTER — Inpatient Hospital Stay

## 2023-10-08 ENCOUNTER — Inpatient Hospital Stay (HOSPITAL_COMMUNITY)
Admit: 2023-10-08 | Discharge: 2023-10-08 | Disposition: A | Discharge status: Home / Self Care | Attending: Internal Medicine | Admitting: Internal Medicine

## 2023-10-08 ENCOUNTER — Encounter
Admission: EM | Disposition: A | Discharge status: Skilled Nursing Facility | Payer: Self-pay | Source: Home / Self Care | Attending: Internal Medicine

## 2023-10-08 DIAGNOSIS — Z01810 Encounter for preprocedural cardiovascular examination: Secondary | ICD-10-CM

## 2023-10-08 DIAGNOSIS — S72002A Fracture of unspecified part of neck of left femur, initial encounter for closed fracture: Secondary | ICD-10-CM | POA: Diagnosis not present

## 2023-10-08 HISTORY — PX: HIP ARTHROPLASTY: SHX981

## 2023-10-08 LAB — CBC
HCT: 31.5 % — ABNORMAL LOW (ref 39.0–52.0)
Hemoglobin: 10.4 g/dL — ABNORMAL LOW (ref 13.0–17.0)
MCH: 30.7 pg (ref 26.0–34.0)
MCHC: 33 g/dL (ref 30.0–36.0)
MCV: 92.9 fL (ref 80.0–100.0)
Platelets: 204 10*3/uL (ref 150–400)
RBC: 3.39 MIL/uL — ABNORMAL LOW (ref 4.22–5.81)
RDW: 13.4 % (ref 11.5–15.5)
WBC: 12.9 10*3/uL — ABNORMAL HIGH (ref 4.0–10.5)
nRBC: 0 % (ref 0.0–0.2)

## 2023-10-08 LAB — ECHOCARDIOGRAM COMPLETE
AR max vel: 3.46 cm2
AV Peak grad: 2.5 mmHg
Ao pk vel: 0.79 m/s
Area-P 1/2: 3.89 cm2
Height: 69 in
S' Lateral: 2.8 cm
Weight: 2240 [oz_av]

## 2023-10-08 LAB — GLUCOSE, CAPILLARY
Glucose-Capillary: 201 mg/dL — ABNORMAL HIGH (ref 70–99)
Glucose-Capillary: 96 mg/dL (ref 70–99)
Glucose-Capillary: 119 mg/dL — ABNORMAL HIGH (ref 70–99)
Glucose-Capillary: 148 mg/dL — ABNORMAL HIGH (ref 70–99)
Glucose-Capillary: 209 mg/dL — ABNORMAL HIGH (ref 70–99)

## 2023-10-08 LAB — CREATININE, SERUM
Creatinine, Ser: 1 mg/dL (ref 0.61–1.24)
GFR, Estimated: >60 mL/min (ref >60)

## 2023-10-08 SURGERY — HEMIARTHROPLASTY (BIPOLAR) HIP, POSTERIOR APPROACH FOR FRACTURE
Anesthesia: Spinal | Laterality: Left

## 2023-10-08 MED ORDER — ZOLPIDEM TARTRATE 5 MG PO TABS: 5.0000 mg | ORAL_TABLET | Freq: Every evening | ORAL | Status: DC | PRN

## 2023-10-08 MED ORDER — ALUM & MAG HYDROXIDE-SIMETH 200-200-20 MG/5ML PO SUSP: 30.0000 mL | ORAL | Status: DC | PRN

## 2023-10-08 MED ORDER — ONDANSETRON HCL 4 MG/2ML IJ SOLN: 4.0000 mg | Freq: Four times a day (QID) | INTRAMUSCULAR | Status: DC | PRN

## 2023-10-08 MED ORDER — MORPHINE SULFATE (PF) 2 MG/ML IV SOLN: 0.5000 mg | INTRAVENOUS | Status: DC | PRN

## 2023-10-08 MED ORDER — METHOCARBAMOL 500 MG PO TABS
500.0000 mg | ORAL_TABLET | Freq: Four times a day (QID) | ORAL | Status: DC | PRN
  Administered 2023-10-12: 500 mg via ORAL
  Filled 2023-10-08: qty 1

## 2023-10-08 MED ORDER — TRANEXAMIC ACID-NACL 1000-0.7 MG/100ML-% IV SOLN
1000.0000 mg | Freq: Once | INTRAVENOUS | Status: AC
  Administered 2023-10-08: 1000 mg via INTRAVENOUS

## 2023-10-08 MED ORDER — KETAMINE HCL 10 MG/ML IJ SOLN
INTRAMUSCULAR | Status: DC | PRN
Start: 2023-10-08 — End: 2023-10-08
  Administered 2023-10-08: 10 mg via INTRAVENOUS
  Administered 2023-10-08: 20 mg via INTRAVENOUS

## 2023-10-08 MED ORDER — FLEET ENEMA RE ENEM: 1.0000 | ENEMA | Freq: Once | RECTAL | Status: DC | PRN

## 2023-10-08 MED ORDER — PROPOFOL 10 MG/ML IV BOLUS
INTRAVENOUS | Status: AC
  Filled 2023-10-08: qty 20

## 2023-10-08 MED ORDER — KETAMINE HCL 50 MG/5ML IJ SOSY
PREFILLED_SYRINGE | INTRAMUSCULAR | Status: AC
  Filled 2023-10-08: qty 5

## 2023-10-08 MED ORDER — FERROUS SULFATE 325 (65 FE) MG PO TABS
325.0000 mg | ORAL_TABLET | Freq: Every day | ORAL | Status: DC
  Administered 2023-10-09 – 2023-10-14 (×6): 325 mg via ORAL
  Filled 2023-10-08 (×6): qty 1

## 2023-10-08 MED ORDER — BISACODYL 10 MG RE SUPP: 10.0000 mg | Freq: Every day | RECTAL | Status: DC | PRN

## 2023-10-08 MED ORDER — PROPOFOL 500 MG/50ML IV EMUL
INTRAVENOUS | Status: DC | PRN
  Administered 2023-10-08: 20 ug/kg/min via INTRAVENOUS

## 2023-10-08 MED ORDER — SODIUM CHLORIDE (PF) 0.9 % IJ SOLN
INTRAMUSCULAR | Status: DC | PRN
  Administered 2023-10-08: 500 mL

## 2023-10-08 MED ORDER — PHENYLEPHRINE HCL-NACL 20-0.9 MG/250ML-% IV SOLN
INTRAVENOUS | Status: DC | PRN
  Administered 2023-10-08: 20 ug/min via INTRAVENOUS

## 2023-10-08 MED ORDER — PHENOL 1.4 % MT LIQD: 1.0000 | OROMUCOSAL | Status: DC | PRN

## 2023-10-08 MED ORDER — CEFAZOLIN SODIUM-DEXTROSE 2-4 GM/100ML-% IV SOLN
INTRAVENOUS | Status: AC
  Filled 2023-10-08: qty 100

## 2023-10-08 MED ORDER — GLYCOPYRROLATE 0.2 MG/ML IJ SOLN
INTRAMUSCULAR | Status: DC | PRN
  Administered 2023-10-08 (×2): .1 mg via INTRAVENOUS

## 2023-10-08 MED ORDER — HYDROCODONE-ACETAMINOPHEN 5-325 MG PO TABS
1.0000 | ORAL_TABLET | ORAL | Status: DC | PRN
  Administered 2023-10-12 – 2023-10-13 (×2): 2 via ORAL
  Filled 2023-10-08 (×2): qty 2
  Filled 2023-10-08: qty 1

## 2023-10-08 MED ORDER — PHENYLEPHRINE HCL-NACL 20-0.9 MG/250ML-% IV SOLN
INTRAVENOUS | Status: AC
  Filled 2023-10-08: qty 250

## 2023-10-08 MED ORDER — METHOCARBAMOL 1000 MG/10ML IJ SOLN: 500.0000 mg | Freq: Four times a day (QID) | INTRAMUSCULAR | Status: DC | PRN

## 2023-10-08 MED ORDER — ACETAMINOPHEN 10 MG/ML IV SOLN
INTRAVENOUS | Status: DC | PRN
  Administered 2023-10-08: 1000 mg via INTRAVENOUS

## 2023-10-08 MED ORDER — METOCLOPRAMIDE HCL 5 MG/ML IJ SOLN: 5.0000 mg | Freq: Three times a day (TID) | INTRAMUSCULAR | Status: DC | PRN

## 2023-10-08 MED ORDER — PROPOFOL 10 MG/ML IV BOLUS
INTRAVENOUS | Status: DC | PRN
  Administered 2023-10-08 (×2): 20 mg via INTRAVENOUS

## 2023-10-08 MED ORDER — FENTANYL CITRATE (PF) 100 MCG/2ML IJ SOLN
INTRAMUSCULAR | Status: AC
  Filled 2023-10-08: qty 2

## 2023-10-08 MED ORDER — SODIUM CHLORIDE 0.45 % IV SOLN: INTRAVENOUS | Status: DC

## 2023-10-08 MED ORDER — TRAMADOL HCL 50 MG PO TABS: 25.0000 mg | ORAL_TABLET | Freq: Once | ORAL | Status: DC | PRN

## 2023-10-08 MED ORDER — CEFAZOLIN SODIUM-DEXTROSE 2-4 GM/100ML-% IV SOLN
2.0000 g | Freq: Three times a day (TID) | INTRAVENOUS | Status: AC
  Administered 2023-10-08 – 2023-10-09 (×3): 2 g via INTRAVENOUS
  Filled 2023-10-08 (×4): qty 100

## 2023-10-08 MED ORDER — FENTANYL CITRATE (PF) 100 MCG/2ML IJ SOLN
INTRAMUSCULAR | Status: DC | PRN
  Administered 2023-10-08: 25 ug via INTRAVENOUS
  Administered 2023-10-08: 50 ug via INTRAVENOUS
  Administered 2023-10-08: 25 ug via INTRAVENOUS

## 2023-10-08 MED ORDER — TRANEXAMIC ACID-NACL 1000-0.7 MG/100ML-% IV SOLN
INTRAVENOUS | Status: AC
  Filled 2023-10-08: qty 100

## 2023-10-08 MED ORDER — BUPIVACAINE HCL (PF) 0.5 % IJ SOLN
INTRAMUSCULAR | Status: DC | PRN
  Administered 2023-10-08: 2.5 mL

## 2023-10-08 MED ORDER — NEOMYCIN-POLYMYXIN B GU 40-200000 IR SOLN
Status: DC | PRN
  Administered 2023-10-08: 4 mL

## 2023-10-08 MED ORDER — 0.9 % SODIUM CHLORIDE (POUR BTL) OPTIME
TOPICAL | Status: DC | PRN
  Administered 2023-10-08: 1000 mL

## 2023-10-08 MED ORDER — EPHEDRINE SULFATE-NACL 50-0.9 MG/10ML-% IV SOSY
PREFILLED_SYRINGE | INTRAVENOUS | Status: DC | PRN
  Administered 2023-10-08: 5 mg via INTRAVENOUS

## 2023-10-08 MED ORDER — DOCUSATE SODIUM 100 MG PO CAPS
100.0000 mg | ORAL_CAPSULE | Freq: Two times a day (BID) | ORAL | Status: DC
  Administered 2023-10-08 – 2023-10-14 (×12): 100 mg via ORAL
  Filled 2023-10-08 (×12): qty 1

## 2023-10-08 MED ORDER — PHENYLEPHRINE 80 MCG/ML (10ML) SYRINGE FOR IV PUSH (FOR BLOOD PRESSURE SUPPORT)
PREFILLED_SYRINGE | INTRAVENOUS | Status: DC | PRN
  Administered 2023-10-08: 80 ug via INTRAVENOUS
  Administered 2023-10-08: 160 ug via INTRAVENOUS
  Administered 2023-10-08: 80 ug via INTRAVENOUS

## 2023-10-08 MED ORDER — ORAL CARE MOUTH RINSE: 15.0000 mL | OROMUCOSAL | Status: DC | PRN

## 2023-10-08 MED ORDER — MENTHOL 3 MG MT LOZG: 1.0000 | LOZENGE | OROMUCOSAL | Status: DC | PRN

## 2023-10-08 MED ORDER — BUPIVACAINE-EPINEPHRINE 0.25% -1:200000 IJ SOLN
INTRAMUSCULAR | Status: DC | PRN
  Administered 2023-10-08: 30 mL

## 2023-10-08 MED ORDER — METOCLOPRAMIDE HCL 10 MG PO TABS: 5.0000 mg | ORAL_TABLET | Freq: Three times a day (TID) | ORAL | Status: DC | PRN

## 2023-10-08 MED ORDER — ONDANSETRON HCL 4 MG PO TABS: 4.0000 mg | ORAL_TABLET | Freq: Four times a day (QID) | ORAL | Status: DC | PRN

## 2023-10-08 MED ORDER — ENOXAPARIN SODIUM 30 MG/0.3ML IJ SOSY
30.0000 mg | PREFILLED_SYRINGE | INTRAMUSCULAR | Status: DC
  Administered 2023-10-09 – 2023-10-14 (×6): 30 mg via SUBCUTANEOUS
  Filled 2023-10-08 (×6): qty 0.3

## 2023-10-08 SURGICAL SUPPLY — 52 items
BLADE SAGITTAL WIDE XTHICK NO (BLADE) ×1 IMPLANT
CHLORAPREP W/TINT 26 (MISCELLANEOUS) ×2 IMPLANT
DRAPE IMP U-DRAPE 54X76 (DRAPES) IMPLANT
DRAPE INCISE IOBAN 66X60 STRL (DRAPES) ×2 IMPLANT
DRAPE SHEET LG 3/4 BI-LAMINATE (DRAPES) IMPLANT
DRAPE TABLE BACK 80X90 (DRAPES) ×1 IMPLANT
ELECT CAUTERY BLADE 6.4 (BLADE) ×1 IMPLANT
ELECT REM PT RETURN 9FT ADLT (ELECTROSURGICAL) ×1 IMPLANT
ELECTRODE REM PT RTRN 9FT ADLT (ELECTROSURGICAL) ×1 IMPLANT
EVACUATOR 1/8 PVC DRAIN (DRAIN) ×1 IMPLANT
GAUZE 4X4 16PLY ~~LOC~~+RFID DBL (SPONGE) ×1 IMPLANT
GAUZE SPONGE 4X4 12PLY STRL (GAUZE/BANDAGES/DRESSINGS) ×2 IMPLANT
GAUZE XEROFORM 1X8 LF (GAUZE/BANDAGES/DRESSINGS) ×2 IMPLANT
GLOVE INDICATOR 8.0 STRL GRN (GLOVE) ×1 IMPLANT
GLOVE SURG ORTHO 8.5 STRL (GLOVE) ×1 IMPLANT
GOWN STRL REUS W/ TWL LRG LVL3 (GOWN DISPOSABLE) ×2 IMPLANT
GOWN STRL REUS W/TWL LRG LVL4 (GOWN DISPOSABLE) ×1 IMPLANT
HEAD UNPLR 51XMDLR STRL HIP (Orthopedic Implant) IMPLANT
HOLDER FOLEY CATH W/STRAP (MISCELLANEOUS) IMPLANT
HOLSTER ELECTROSUGICAL PENCIL (MISCELLANEOUS) ×1 IMPLANT
IV NS 1000ML BAXH (IV SOLUTION) ×1 IMPLANT
KIT TURNOVER KIT A (KITS) ×1 IMPLANT
MANIFOLD NEPTUNE II (INSTRUMENTS) ×1 IMPLANT
NDL FILTER BLUNT 18X1 1/2 (NEEDLE) ×1 IMPLANT
NDL SPNL 18GX3.5 QUINCKE PK (NEEDLE) ×2 IMPLANT
NEEDLE FILTER BLUNT 18X1 1/2 (NEEDLE) ×1 IMPLANT
NEEDLE SPNL 18GX3.5 QUINCKE PK (NEEDLE) ×2 IMPLANT
NS IRRIG 1000ML POUR BTL (IV SOLUTION) ×1 IMPLANT
PACK HIP PROSTHESIS (MISCELLANEOUS) ×1 IMPLANT
PAD ABD DERMACEA PRESS 5X9 (GAUZE/BANDAGES/DRESSINGS) IMPLANT
PULSAVAC PLUS IRRIG FAN TIP (DISPOSABLE) ×1 IMPLANT
SLEEVE UNITRAX V40 +4 (Orthopedic Implant) IMPLANT
SOL PREP PVP 2OZ (MISCELLANEOUS) ×1 IMPLANT
SOLUTION PREP PVP 2OZ (MISCELLANEOUS) ×1 IMPLANT
SPONGE T-LAP 18X18 ~~LOC~~+RFID (SPONGE) ×4 IMPLANT
STAPLER SKIN PROX 35W (STAPLE) ×1 IMPLANT
STEM ACCOLADE II SZ8 (Stem) IMPLANT
SUT STRATA PDS 2-0 CT-1 (SUTURE) ×1 IMPLANT
SUT STRATAFIX 14 PDO 48 VLT (SUTURE) ×1 IMPLANT
SUT STRATAFIX PDO 1 14 VIOLET (SUTURE) ×1 IMPLANT
SUT TICRON 2-0 30IN 311381 (SUTURE) ×4 IMPLANT
SYR 10ML LL (SYRINGE) ×1 IMPLANT
SYR 30ML LL (SYRINGE) ×1 IMPLANT
SYR 50ML LL SCALE MARK (SYRINGE) ×1 IMPLANT
TAPE MICROFOAM 4IN (TAPE) ×1 IMPLANT
TIP FAN IRRIG PULSAVAC PLUS (DISPOSABLE) ×1 IMPLANT
TOWEL OR 17X26 4PK STRL BLUE (TOWEL DISPOSABLE) IMPLANT
TRAP FLUID SMOKE EVACUATOR (MISCELLANEOUS) ×1 IMPLANT
TRAY FOLEY MTR SLVR 16FR STAT (SET/KITS/TRAYS/PACK) IMPLANT
TUBE SUCT KAM VAC (TUBING) ×1 IMPLANT
WATER STERILE IRR 1000ML POUR (IV SOLUTION) ×1 IMPLANT
WATER STERILE IRR 500ML POUR (IV SOLUTION) ×1 IMPLANT

## 2023-10-08 NOTE — Plan of Care (Signed)

## 2023-10-08 NOTE — Consult Note (Signed)
 ORTHOPAEDIC CONSULTATION  REQUESTING PHYSICIAN: Charise Killian, MD  Chief Complaint: Left hip pain  HPI: Troy Preston is a 89 y.o. male who complains of left hip pain after a fall at assisted living last night.  He stays in [facility].  He was brought to the emergency room and her exam and x-rays revealed a displaced subcapital fracture of the left hip.  His son is present for discussion of treatment.  The patient has some dementia.  I have recommended a left hip hemiarthroplasty to remedy this situation and the son agrees.  Risk and benefits and postop protocol were discussed with him at length.  They wish to proceed today.  Past Medical History:  Diagnosis Date   Constipation    Diabetes mellitus without complication (HCC)    Esophagitis    HOH (hard of hearing)    Impaired glucose tolerance    Tinnitus    Urinary frequency    Wears hearing aid in both ears    Past Surgical History:  Procedure Laterality Date   CATARACT EXTRACTION W/PHACO Right 07/07/2021   Procedure: CATARACT EXTRACTION PHACO AND INTRAOCULAR LENS PLACEMENT (IOC) RIGHT DIABETIC 24.31 01:55.6;  Surgeon: Galen Manila, MD;  Location: [facility];  Service: Ophthalmology;  Laterality: Right;  Diabetic   CATARACT EXTRACTION W/PHACO Left 07/28/2021   Procedure: CATARACT EXTRACTION PHACO AND INTRAOCULAR LENS PLACEMENT (IOC) LEFT DIABETIC 25.13 02:01.6;  Surgeon: Galen Manila, MD;  Location: [facility];  Service: Ophthalmology;  Laterality: Left;  Diabetic   Palmer fascietomy and release of Dupuytren's contracture     right little finger, left ring and little fingers   Social History   Socioeconomic History   Marital status: Widowed    Spouse name: Not on file   Number of children: Not on file   Years of education: Not on file   Highest education level: Not on file  Occupational History   Not on file  Tobacco Use   Smoking status: Former    Types: Cigarettes   Smokeless  tobacco: Current    Types: Chew   Tobacco comments:    Quit smoking "over 20 years ago"  Vaping Use   Vaping status: Never Used  Substance and Sexual Activity   Alcohol use: No    Alcohol/week: 0.0 standard drinks of alcohol   Drug use: Not on file   Sexual activity: Not on file  Other Topics Concern   Not on file  Social History Narrative   Not on file   Social Drivers of Health   Financial Resource Strain: Low Risk  (06/03/2017)   Received from [facility], [facility]   Overall Financial Resource Strain (CARDIA)    Difficulty of Paying Living Expenses: Not very hard  Food Insecurity: No Food Insecurity (10/07/2023)   Hunger Vital Sign    Worried About Running Out of Food in the Last Year: Never true    Ran Out of Food in the Last Year: Never true  Transportation Needs: No Transportation Needs (10/07/2023)   PRAPARE - Administrator, Civil Service (Medical): No    Lack of Transportation (Non-Medical): No  Physical Activity: Unknown (06/03/2017)   Received from [facility], [facility]   Exercise Vital Sign    Days of Exercise per Week: Patient declined    Minutes of Exercise per Session: Patient declined  Stress: No Stress Concern Present (06/03/2017)   Received from [facility], [facility]   [employer] of Occupational Health - Occupational Stress Questionnaire    Feeling of Stress : Only a little  Social Connections: Socially Isolated (10/07/2023)   Social Connection and Isolation Panel [NHANES]    Frequency of Communication with Friends and Family: More than three times a week    Frequency of Social Gatherings with Friends and Family: Twice a week    Attends Religious Services: Never    Database administrator or Organizations: No    Attends Banker Meetings: Never    Marital Status: Widowed   Family History  Problem Relation Age  of Onset   Cancer Other    Diabetes Mellitus II Mother    Heart attack Father    No Known Allergies Prior to Admission medications   Medication Sig Start Date End Date Taking? Authorizing Provider  acetaminophen (TYLENOL) 325 MG tablet Take 650 mg by mouth every 6 (six) hours as needed.   Yes [provider]  Emollient (CERAVE MOISTURIZING EX) Apply 1 Application topically daily.   Yes [provider]  ketoconazole (NIZORAL) 2 % cream Apply 1 Application topically daily. 10/04/23  Yes [provider]  LORazepam (ATIVAN) 0.5 MG tablet 0.5 mg every 8 (eight) hours as needed for anxiety or sedation. 11/06/14  Yes [provider]  magnesium hydroxide (MILK OF MAGNESIA) 400 MG/5ML suspension Take 30 mLs by mouth daily as needed for moderate constipation (if no bowel movement in past 24 hours).   Yes [provider]  melatonin 5 MG TABS Take 5 mg by mouth at bedtime.   Yes [provider]  metFORMIN (GLUCOPHAGE) 1000 MG tablet Take 1,000 mg by mouth daily with breakfast.   Yes [provider]  omeprazole (PRILOSEC) 20 MG capsule Take 20 mg by mouth.   Yes [provider]  polyethylene glycol powder (GLYCOLAX/MIRALAX) 17 GM/SCOOP powder Take 17 g by mouth daily as needed for mild constipation or moderate constipation. 02/25/17  Yes [provider]  PSYLLIUM FIBER PO Take 0.4 g by mouth at bedtime.   Yes [provider]  senna-docusate (SENOKOT-S) 8.6-50 MG tablet Take 1 tablet by mouth at bedtime. Hold for loose stools   Yes [provider]  Sodium Fluoride (CLINPRO 5000) 1.1 % PSTE Place 1 Application onto teeth daily.   Yes [provider]  tamsulosin (FLOMAX) 0.4 MG CAPS capsule Take 0.4 mg by mouth at bedtime. 09/28/23  Yes [provider]  ASPIRIN 81 PO Take by mouth daily. Patient not taking: Reported on 10/07/2023    [provider]  bisacodyl (DULCOLAX) 5 MG EC tablet Take  5 mg by mouth. Patient not taking: Reported on 10/07/2023    [provider]  Cinnamon 500 MG capsule Take 500 mg by mouth. Patient not taking: Reported on 10/07/2023    [provider]  ibuprofen (ADVIL,MOTRIN) 100 MG tablet Take 100 mg by mouth every 6 (six) hours as needed for fever. Patient not taking: Reported on 10/07/2023    [provider]  Testosterone (ANDROGEL) 20.25 MG/1.25GM (1.62%) GEL Apply 20 each topically daily. Patient needs to apply 2 pumps daily 12/26/14   Michiel Cowboy A, PA-C  vitamin B-12 (CYANOCOBALAMIN) 500 MCG tablet Take by mouth. Patient not taking: Reported on 10/07/2023    [provider]   DG Chest Portable 1 View Result Date: 10/07/2023 CLINICAL DATA:  Status post fall, left hip fracture. EXAM: PORTABLE CHEST 1 VIEW COMPARISON:  None available. FINDINGS: Lung volumes are low. Upper normal heart size. Mild aortic atherosclerosis and tortuosity. No focal airspace disease, pulmonary edema, pleural effusion or pneumothorax. No displaced rib fracture. IMPRESSION: Low lung volumes without acute chest findings. Electronically Signed   By: Narda Rutherford M.D.   On: 10/07/2023 15:57   DG HIP UNILAT WITH PELVIS 2-3 VIEWS LEFT Result Date: 10/07/2023 CLINICAL DATA:  Fall yesterday with left hip pain. EXAM: DG HIP (WITH OR WITHOUT PELVIS) 2-3V LEFT COMPARISON:  None Available. FINDINGS: Displaced left femoral neck fracture. Slight proximal migration of the femoral shaft. Apex anterior angulation. The femoral head remains seated. Bony pelvis is intact including pubic rami. No pubic symphyseal or sacroiliac diastasis. IMPRESSION: Displaced left femoral neck fracture. Electronically Signed   By: Narda Rutherford M.D.   On: 10/07/2023 15:55   CT HEAD WO CONTRAST ( ) Result Date: 10/07/2023 CLINICAL DATA:  Head trauma, minor (Age >= 65y); Neck trauma (Age >= 65y). Fall yesterday with head strike. EXAM: CT HEAD WITHOUT CONTRAST CT CERVICAL SPINE  WITHOUT CONTRAST TECHNIQUE: Multidetector CT imaging of the head and cervical spine was performed following the standard protocol without intravenous contrast. Multiplanar CT image reconstructions of the cervical spine were also generated. RADIATION DOSE REDUCTION: This exam was performed according to the departmental dose-optimization program which includes automated exposure control, adjustment of the mA and/or kV according to patient size and/or use of iterative reconstruction technique. COMPARISON:  CT head and cervical spine 04/14/2023 FINDINGS: CT HEAD FINDINGS Brain: There is no evidence of an acute infarct, intracranial hemorrhage, mass, midline shift, or extra-axial fluid collection. There is unchanged mild cerebral atrophy. Cerebral white matter hypodensities are similar to the prior CT and are nonspecific but compatible with mild chronic small vessel ischemic disease. Vascular: No hyperdense vessel. Skull: No acute fracture or suspicious lesion. Sinuses/Orbits: Chronic posterior left ethmoid air cell opacification. New small to moderate volume fluid in the left sphenoid and right maxillary sinuses with mild mucosal thickening in the latter as well. Increased, large volume fluid and mucosal thickening resulting in near complete opacification of the left maxillary sinus. Clear mastoid air cells. Bilateral cataract extraction. Other: None. CT CERVICAL SPINE FINDINGS Alignment: Normal. Skull base and vertebrae: No acute fracture or suspicious lesion. Soft tissues and spinal canal: No prevertebral fluid or swelling. No visible canal hematoma. Disc levels: No significant disc degeneration for age. Moderately advanced facet arthrosis in the mid and upper cervical spine, overall worse on the right. Upper chest: Biapical lung scarring. Other: None. IMPRESSION: 1. No evidence of acute intracranial abnormality or cervical spine fracture. 2. Mild chronic small vessel ischemic disease. 3. Sinusitis, including near  complete opacification of the left maxillary sinus by fluid. Correlate for acute sinusitis. Electronically Signed   By: Sebastian Ache M.D.   On: 10/07/2023 15:15   CT Cervical Spine Wo Contrast Result Date: 10/07/2023 CLINICAL DATA:  Head trauma, minor (Age >= 65y); Neck trauma (Age >= 65y). Fall yesterday with head strike. EXAM: CT HEAD WITHOUT CONTRAST CT CERVICAL SPINE WITHOUT CONTRAST TECHNIQUE: Multidetector CT imaging of the head and cervical spine was performed following the standard protocol without intravenous contrast. Multiplanar CT image reconstructions of the cervical spine were also generated. RADIATION DOSE REDUCTION: This exam was performed according to the departmental dose-optimization program which includes automated exposure control, adjustment of the mA and/or kV according to patient size and/or use of iterative reconstruction technique. COMPARISON:  CT head and cervical spine 04/14/2023 FINDINGS: CT HEAD FINDINGS Brain: There  is no evidence of an acute infarct, intracranial hemorrhage, mass, midline shift, or extra-axial fluid collection. There is unchanged mild cerebral atrophy. Cerebral white matter hypodensities are similar to the prior CT and are nonspecific but compatible with mild chronic small vessel ischemic disease. Vascular: No hyperdense vessel. Skull: No acute fracture or suspicious lesion. Sinuses/Orbits: Chronic posterior left ethmoid air cell opacification. New small to moderate volume fluid in the left sphenoid and right maxillary sinuses with mild mucosal thickening in the latter as well. Increased, large volume fluid and mucosal thickening resulting in near complete opacification of the left maxillary sinus. Clear mastoid air cells. Bilateral cataract extraction. Other: None. CT CERVICAL SPINE FINDINGS Alignment: Normal. Skull base and vertebrae: No acute fracture or suspicious lesion. Soft tissues and spinal canal: No prevertebral fluid or swelling. No visible canal  hematoma. Disc levels: No significant disc degeneration for age. Moderately advanced facet arthrosis in the mid and upper cervical spine, overall worse on the right. Upper chest: Biapical lung scarring. Other: None. IMPRESSION: 1. No evidence of acute intracranial abnormality or cervical spine fracture. 2. Mild chronic small vessel ischemic disease. 3. Sinusitis, including near complete opacification of the left maxillary sinus by fluid. Correlate for acute sinusitis. Electronically Signed   By: Sebastian Ache M.D.   On: 10/07/2023 15:15    Positive ROS: All other systems have been reviewed and were otherwise negative with the exception of those mentioned in the HPI and as above.  Physical Exam: General: Alert, no acute distress Cardiovascular: No pedal edema Respiratory: No cyanosis, no use of accessory musculature GI: No organomegaly, abdomen is soft and non-tender Skin: No lesions in the area of chief complaint Neurologic: Sensation intact distally Psychiatric: Patient is competent for consent with normal mood and affect Lymphatic: No axillary or cervical lymphadenopathy  MUSCULOSKELETAL: The patient is awake and alert.  He has some confusion.  The left leg is slightly shortened and externally rotated.  There is pain with movement of the left hip.  The skin is intact.  Neurovascular status is normal distally.  The right leg has good motion without pain.  Both upper extremities have good range of motion without pain.  The spine is nontender.  Assessment: Displaced subcapital fracture left hip  Plan: Left hip hemiarthroplasty today    Valinda Hoar, MD [phone]   10/08/2023 11:41 AM

## 2023-10-08 NOTE — Transfer of Care (Signed)
 Immediate Anesthesia Transfer of Care Note  Patient: Troy Preston  Procedure(s) Performed: HEMIARTHROPLASTY (BIPOLAR) HIP, POSTERIOR APPROACH FOR FRACTURE (Left)  Patient Location: PACU  Anesthesia Type:Spinal  Level of Consciousness: drowsy  Airway & Oxygen Therapy: Patient Spontanous Breathing and Patient connected to face mask oxygen  Post-op Assessment: Report given to RN  Post vital signs: stable  Last Vitals:  Vitals Value Taken Time  BP 124/63 10/08/23 1436  Temp 37.2 C 10/08/23 1436  Pulse 83 10/08/23 1438  Resp 14 10/08/23 1438  SpO2 100 % 10/08/23 1438  Vitals shown include unfiled device data.  Last Pain:  Vitals:   10/08/23 0720  TempSrc:   PainSc: 0-No pain         Complications: No notable events documented.

## 2023-10-08 NOTE — Progress Notes (Signed)
 PROGRESS NOTE    Troy Preston  ZOX:[id number] DOB: 1934-01-24 DOA: 10/07/2023 PCP: Barbette Reichmann, MD  Assessment & Plan:   Principal Problem:   Hip fracture (HCC) Active Problems:   Femoral neck fracture (HCC)  Assessment and Plan: Left femoral neck fracture: will go for surg today as per ortho surg. Norco, morphine prn for pain. Ortho surg following and recs apprec     DM2: well controlled, HbA1c 6.6. Continue on SSI w/ accuchecks   Likely ACD: likely secondary to DM2. No need for a transfusion currently        DVT prophylaxis: lovenox Code Status: DNR Family Communication: discussed pt's care w/ pt's son at bedside and answered his questions  Disposition Plan: depends on PT/OT recs (not consulted yet)  Level of care: Med-Surg  Status is: Inpatient Remains inpatient appropriate because: severity of illness    Consultants:  Ortho surg   Procedures:   Antimicrobials:    Subjective: Pt c/o hip pain   Objective: Vitals:   10/07/23 1855 10/07/23 2040 10/08/23 0454 10/08/23 0759  BP: (!) 153/66 (!) 140/96 130/71 (!) 140/65  Pulse: 74 90 68 73  Resp: (!) 22 16 18 16   Temp: 98.6 F (37 C) (!) 97.4 F (36.3 C) 97.8 F (36.6 C) 97.9 F (36.6 C)  TempSrc: Oral     SpO2: 94% 96% 95% 90%  Weight:      Height:        Intake/Output Summary (Last 24 hours) at 10/08/2023 0856 Last data filed at 10/08/2023 0536 Gross per 24 hour  Intake 339.14 ml  Output 400 ml  Net -60.86 ml   Filed Weights   10/07/23 1357  Weight: 63.5 kg    Examination:  General exam: Appears calm and comfortable  Respiratory system: Clear to auscultation. Respiratory effort normal. Cardiovascular system: S1 & S2+. No rubs, gallops or clicks.  Gastrointestinal system: Abdomen is nondistended, soft and nontender.  Normal bowel sounds heard. Central nervous system: Alert and awake. Moves all extremities  Psychiatry: Judgement and insight appears at baseline. Flat mood and  affect    Data Reviewed: I have personally reviewed following labs and imaging studies  CBC: Recent Labs  Lab 10/07/23 1359  WBC 10.8*  HGB 11.2*  HCT 35.2*  MCV 95.1  PLT 248   Basic Metabolic Panel: Recent Labs  Lab 10/07/23 1359  NA 136  K 3.7  CL 104  CO2 21*  GLUCOSE 314*  BUN 21  CREATININE 1.20  CALCIUM 8.7*   GFR: Estimated Creatinine Clearance: 39 mL/min (by C-G formula based on SCr of 1.2 mg/dL). Liver Function Tests: No results for input(s): "AST", "ALT", "ALKPHOS", "BILITOT", "PROT", "ALBUMIN" in the last 168 hours. No results for input(s): "LIPASE", "AMYLASE" in the last 168 hours. No results for input(s): "AMMONIA" in the last 168 hours. Coagulation Profile: Recent Labs  Lab 10/07/23 1707  INR 1.2   Cardiac Enzymes: No results for input(s): "CKTOTAL", "CKMB", "CKMBINDEX", "TROPONINI" in the last 168 hours. BNP (last 3 results) No results for input(s): "PROBNP" in the last 8760 hours. HbA1C: Recent Labs    10/07/23 1359  HGBA1C 6.6*   CBG: Recent Labs  Lab 10/07/23 2134 10/08/23 0759  GLUCAP 258* 201*   Lipid Profile: No results for input(s): "CHOL", "HDL", "LDLCALC", "TRIG", "CHOLHDL", "LDLDIRECT" in the last 72 hours. Thyroid Function Tests: No results for input(s): "TSH", "T4TOTAL", "FREET4", "T3FREE", "THYROIDAB" in the last 72 hours. Anemia Panel: No results for input(s): "VITAMINB12", "FOLATE", "FERRITIN", "  TIBC", "IRON", "RETICCTPCT" in the last 72 hours. Sepsis Labs: No results for input(s): "PROCALCITON", "LATICACIDVEN" in the last 168 hours.  No results found for this or any previous visit (from the past 240 hours).       Radiology Studies: DG Chest Portable 1 View Result Date: 10/07/2023 CLINICAL DATA:  Status post fall, left hip fracture. EXAM: PORTABLE CHEST 1 VIEW COMPARISON:  None available. FINDINGS: Lung volumes are low. Upper normal heart size. Mild aortic atherosclerosis and tortuosity. No focal airspace  disease, pulmonary edema, pleural effusion or pneumothorax. No displaced rib fracture. IMPRESSION: Low lung volumes without acute chest findings. Electronically Signed   By: Narda Rutherford M.D.   On: 10/07/2023 15:57   DG HIP UNILAT WITH PELVIS 2-3 VIEWS LEFT Result Date: 10/07/2023 CLINICAL DATA:  Fall yesterday with left hip pain. EXAM: DG HIP (WITH OR WITHOUT PELVIS) 2-3V LEFT COMPARISON:  None Available. FINDINGS: Displaced left femoral neck fracture. Slight proximal migration of the femoral shaft. Apex anterior angulation. The femoral head remains seated. Bony pelvis is intact including pubic rami. No pubic symphyseal or sacroiliac diastasis. IMPRESSION: Displaced left femoral neck fracture. Electronically Signed   By: Narda Rutherford M.D.   On: 10/07/2023 15:55   CT HEAD WO CONTRAST ( ) Result Date: 10/07/2023 CLINICAL DATA:  Head trauma, minor (Age >= 65y); Neck trauma (Age >= 65y). Fall yesterday with head strike. EXAM: CT HEAD WITHOUT CONTRAST CT CERVICAL SPINE WITHOUT CONTRAST TECHNIQUE: Multidetector CT imaging of the head and cervical spine was performed following the standard protocol without intravenous contrast. Multiplanar CT image reconstructions of the cervical spine were also generated. RADIATION DOSE REDUCTION: This exam was performed according to the departmental dose-optimization program which includes automated exposure control, adjustment of the mA and/or kV according to patient size and/or use of iterative reconstruction technique. COMPARISON:  CT head and cervical spine 04/14/2023 FINDINGS: CT HEAD FINDINGS Brain: There is no evidence of an acute infarct, intracranial hemorrhage, mass, midline shift, or extra-axial fluid collection. There is unchanged mild cerebral atrophy. Cerebral white matter hypodensities are similar to the prior CT and are nonspecific but compatible with mild chronic small vessel ischemic disease. Vascular: No hyperdense vessel. Skull: No acute fracture or  suspicious lesion. Sinuses/Orbits: Chronic posterior left ethmoid air cell opacification. New small to moderate volume fluid in the left sphenoid and right maxillary sinuses with mild mucosal thickening in the latter as well. Increased, large volume fluid and mucosal thickening resulting in near complete opacification of the left maxillary sinus. Clear mastoid air cells. Bilateral cataract extraction. Other: None. CT CERVICAL SPINE FINDINGS Alignment: Normal. Skull base and vertebrae: No acute fracture or suspicious lesion. Soft tissues and spinal canal: No prevertebral fluid or swelling. No visible canal hematoma. Disc levels: No significant disc degeneration for age. Moderately advanced facet arthrosis in the mid and upper cervical spine, overall worse on the right. Upper chest: Biapical lung scarring. Other: None. IMPRESSION: 1. No evidence of acute intracranial abnormality or cervical spine fracture. 2. Mild chronic small vessel ischemic disease. 3. Sinusitis, including near complete opacification of the left maxillary sinus by fluid. Correlate for acute sinusitis. Electronically Signed   By: Sebastian Ache M.D.   On: 10/07/2023 15:15   CT Cervical Spine Wo Contrast Result Date: 10/07/2023 CLINICAL DATA:  Head trauma, minor (Age >= 65y); Neck trauma (Age >= 65y). Fall yesterday with head strike. EXAM: CT HEAD WITHOUT CONTRAST CT CERVICAL SPINE WITHOUT CONTRAST TECHNIQUE: Multidetector CT imaging of the head and cervical spine  was performed following the standard protocol without intravenous contrast. Multiplanar CT image reconstructions of the cervical spine were also generated. RADIATION DOSE REDUCTION: This exam was performed according to the departmental dose-optimization program which includes automated exposure control, adjustment of the mA and/or kV according to patient size and/or use of iterative reconstruction technique. COMPARISON:  CT head and cervical spine 04/14/2023 FINDINGS: CT HEAD FINDINGS  Brain: There is no evidence of an acute infarct, intracranial hemorrhage, mass, midline shift, or extra-axial fluid collection. There is unchanged mild cerebral atrophy. Cerebral white matter hypodensities are similar to the prior CT and are nonspecific but compatible with mild chronic small vessel ischemic disease. Vascular: No hyperdense vessel. Skull: No acute fracture or suspicious lesion. Sinuses/Orbits: Chronic posterior left ethmoid air cell opacification. New small to moderate volume fluid in the left sphenoid and right maxillary sinuses with mild mucosal thickening in the latter as well. Increased, large volume fluid and mucosal thickening resulting in near complete opacification of the left maxillary sinus. Clear mastoid air cells. Bilateral cataract extraction. Other: None. CT CERVICAL SPINE FINDINGS Alignment: Normal. Skull base and vertebrae: No acute fracture or suspicious lesion. Soft tissues and spinal canal: No prevertebral fluid or swelling. No visible canal hematoma. Disc levels: No significant disc degeneration for age. Moderately advanced facet arthrosis in the mid and upper cervical spine, overall worse on the right. Upper chest: Biapical lung scarring. Other: None. IMPRESSION: 1. No evidence of acute intracranial abnormality or cervical spine fracture. 2. Mild chronic small vessel ischemic disease. 3. Sinusitis, including near complete opacification of the left maxillary sinus by fluid. Correlate for acute sinusitis. Electronically Signed   By: Sebastian Ache M.D.   On: 10/07/2023 15:15        Scheduled Meds:  celecoxib  200 mg Oral BID   enoxaparin (LOVENOX) injection  40 mg Subcutaneous Q24H   gabapentin  200 mg Oral BID   insulin aspart  0-15 Units Subcutaneous TID WC   insulin aspart  0-5 Units Subcutaneous QHS   metoprolol tartrate  12.5 mg Oral BID   pantoprazole  40 mg Oral Daily   Continuous Infusions:  sodium chloride 75 mL/hr at 10/08/23 0313    ceFAZolin (ANCEF) IV      tranexamic acid       LOS: 1 day       Charise Killian, MD [facility] Pager [phone]  If 7PM-7AM, please contact night-coverage 10/08/2023, 8:56 AM

## 2023-10-08 NOTE — Anesthesia Preprocedure Evaluation (Addendum)
 Anesthesia Evaluation  Patient identified by MRN, date of birth, ID band Patient confused    Reviewed: Allergy & Precautions, H&P , NPO status , Patient's Chart, lab work & pertinent test results  Airway Mallampati: III  TM Distance: >3 FB Neck ROM: full    Dental  (+) Chipped, Missing, Poor Dentition   Pulmonary former smoker   Pulmonary exam normal        Cardiovascular Normal cardiovascular exam+ Valvular Problems/Murmurs AI      Neuro/Psych       Dementia questionable Parkinson's disease chronic ambulation impairment uses roller walker to ambulate    GI/Hepatic Neg liver ROS,GERD  ,,  Endo/Other  diabetes, Well Controlled, Type 2    Renal/GU      Musculoskeletal   Abdominal Normal abdominal exam  (+)   Peds  Hematology negative hematology ROS (+)   Anesthesia Other Findings Past Medical History: No date: Constipation No date: Diabetes mellitus without complication (HCC) No date: Esophagitis No date: HOH (hard of hearing) No date: Impaired glucose tolerance No date: Tinnitus No date: Urinary frequency No date: Wears hearing aid in both ears  Past Surgical History: 07/07/2021: CATARACT EXTRACTION W/PHACO; Right     Comment:  Procedure: CATARACT EXTRACTION PHACO AND INTRAOCULAR               LENS PLACEMENT (IOC) RIGHT DIABETIC 24.31 01:55.6;                Surgeon: Galen Manila, MD;  Location: [facility]              [facility];  Service: Ophthalmology;  Laterality: Right;                Diabetic 07/28/2021: CATARACT EXTRACTION W/PHACO; Left     Comment:  Procedure: CATARACT EXTRACTION PHACO AND INTRAOCULAR               LENS PLACEMENT (IOC) LEFT DIABETIC 25.13 02:01.6;                Surgeon: Galen Manila, MD;  Location: [facility]              [facility];  Service: Ophthalmology;  Laterality: Left;                Diabetic No date: Palmer fascietomy and release of Dupuytren's contracture      Comment:  right little finger, left ring and little fingers  BMI    Body Mass Index: 20.67 kg/m      Reproductive/Obstetrics negative OB ROS                             Anesthesia Physical Anesthesia Plan  ASA: 3  Anesthesia Plan: Spinal   Post-op Pain Management: Ofirmev IV (intra-op)* and Precedex   Induction: Intravenous  PONV Risk Score and Plan: Propofol infusion  Airway Management Planned: Natural Airway  Additional Equipment:   Intra-op Plan:   Post-operative Plan:   Informed Consent: I have reviewed the patients History and Physical, chart, labs and discussed the procedure including the risks, benefits and alternatives for the proposed anesthesia with the patient or authorized representative who has indicated his/her understanding and acceptance.     Dental Advisory Given and Consent reviewed with POA  Plan Discussed with: CRNA and Surgeon  Anesthesia Plan Comments:         Anesthesia Quick Evaluation

## 2023-10-08 NOTE — Progress Notes (Signed)
 Initial Nutrition Assessment  DOCUMENTATION CODES:   Not applicable  INTERVENTION:   Ensure Enlive po TID, each supplement provides 350 kcal and 20 grams of protein.  MVI po daily   Pt at high refeed risk; recommend monitor potassium, magnesium and phosphorus labs daily until stable  Daily weights   NUTRITION DIAGNOSIS:   Increased nutrient needs related to hip fracture as evidenced by estimated needs.  GOAL:   Patient will meet greater than or equal to 90% of their needs  MONITOR:   PO intake, Supplement acceptance, Labs, Weight trends, Skin, I & O's  REASON FOR ASSESSMENT:   Consult Hip fracture protocol  ASSESSMENT:   89 y.o. male with medical history significant of DM, dementia, questionable Parkinson's disease, GERD, BPH and HOH who is admitted with hip fracture after fall.  RD unable to speak with pt today as pt in the OR at the time of RD visit. Pt with increased estimated needs secondary to hip fracture. RD will add supplements and MVI to help pt meet his estimated needs. Pt is at refeed risk. Per chart, pt appears to be down ~23lbs(14%) over the past 6 months; this is significant weight loss. RD will obtain exam at follow up.   Medications reviewed and include: colace, lovenox, ferrous sulfate, insulin, protonix, NaCl @75ml /hr  Labs reviewed: K 3.7 wnl- 3/21 Wbc- 12.9(H), Hgb 10.4(L), Hct 31.5(L) Cbgs- 148, 119, 96, 201 x 24 hrs  AIC 6.6(H)  NUTRITION - FOCUSED PHYSICAL EXAM: Unable to perform at this time   Diet Order:   Diet Order     None      EDUCATION NEEDS:   Not appropriate for education at this time  Skin:  Skin Assessment: Reviewed RN Assessment (incision L hip)  Last BM:  3/18  Height:   Ht Readings from Last 1 Encounters:  10/07/23 5\' 9"  (1.753 m)    Weight:   Wt Readings from Last 1 Encounters:  10/07/23 63.5 kg    Ideal Body Weight:  72.7 kg  BMI:  Body mass index is 20.67 kg/m.  Estimated Nutritional Needs:    Kcal:  1700-1900kcal/day  Protein:  85-95g/day  Fluid:  1.7-1.9L/day  Betsey Holiday MS, RD, LDN If unable to be reached, please send secure chat to "RD inpatient" available from 8:00a-4:00p daily

## 2023-10-08 NOTE — Anesthesia Postprocedure Evaluation (Signed)
 Anesthesia Post Note  Patient: [name]  Procedure(s) Performed: HEMIARTHROPLASTY (BIPOLAR) HIP, POSTERIOR APPROACH FOR FRACTURE (Left)  Patient location during evaluation: PACU Anesthesia Type: Spinal Level of consciousness: confused and awake Pain management: pain level controlled Vital Signs Assessment: post-procedure vital signs reviewed and stable Respiratory status: spontaneous breathing, nonlabored ventilation, respiratory function stable and patient connected to nasal cannula oxygen Cardiovascular status: blood pressure returned to baseline and stable Postop Assessment: no apparent nausea or vomiting Anesthetic complications: no   No notable events documented.   Last Vitals:  Vitals:   10/08/23 1713 10/08/23 1937  BP: 112/62 (!) 104/49  Pulse: 70 68  Resp: 16 18  Temp: 36.7 C (!) 36.3 C  SpO2: (!) 83% 96%    Last Pain:  Vitals:   10/08/23 1937  TempSrc: Oral  PainSc: 0-No pain                 Foye Deer

## 2023-10-08 NOTE — Anesthesia Procedure Notes (Signed)
 Spinal  Patient location during procedure: OR Start time: 10/08/2023 12:00 PM End time: 10/08/2023 12:10 PM Reason for block: surgical anesthesia Staffing Performed: anesthesiologist  Anesthesiologist: Foye Deer, MD Performed by: Foye Deer, MD Authorized by: Foye Deer, MD   Preanesthetic Checklist Completed: patient identified, IV checked, site marked, risks and benefits discussed, surgical consent, monitors and equipment checked, pre-op evaluation and timeout performed Spinal Block Patient position: sitting Prep: DuraPrep Patient monitoring: heart rate, cardiac monitor, continuous pulse ox and blood pressure Approach: midline Location: L3-4 Injection technique: single-shot Needle Needle type: Pencan  Needle gauge: 24 G Needle length: 9 cm Assessment Sensory level: T10 Events: CSF return Additional Notes 4 attempts, L3/4, L2/3, L paramedian L3/4, and finally midline attempted l3/4 with success

## 2023-10-08 NOTE — H&P (Signed)
THE PATIENT WAS SEEN PRIOR TO SURGERY TODAY.  HISTORY, ALLERGIES, HOME MEDICATIONS AND OPERATIVE PROCEDURE WERE REVIEWED. RISKS AND BENEFITS OF SURGERY DISCUSSED WITH PATIENT AGAIN.  NO CHANGES FROM INITIAL HISTORY AND PHYSICAL NOTED.    

## 2023-10-08 NOTE — Plan of Care (Signed)
  Problem: Nutritional: Goal: Maintenance of adequate nutrition will improve Outcome: Progressing   Problem: Clinical Measurements: Goal: Will remain free from infection Outcome: Progressing   Problem: Pain Managment: Goal: General experience of comfort will improve and/or be controlled Outcome: Progressing   Problem: Safety: Goal: Ability to remain free from injury will improve Outcome: Progressing   Problem: Skin Integrity: Goal: Risk for impaired skin integrity will decrease Outcome: Progressing

## 2023-10-08 NOTE — Op Note (Signed)
 10/08/2023  2:43 PM  PATIENT:  Troy Preston   MRN: [id number]  PRE-OPERATIVE DIAGNOSIS:  Displaced Subcapital fracture left hip   POST-OPERATIVE DIAGNOSIS: Same  PROCEDURE: Left   hip hemiarthroplasty with Stryker Accolade prosthesis  PREOPERATIVE INDICATIONS:  Troy Preston is an 89 y.o. male who was admitted 10/07/2023 with a diagnosis of displaced subcapital fracture of the hip and elected for surgical management.  The risks benefits and alternatives were discussed with the patient including but not limited to the risks of nonoperative treatment, versus surgical intervention including infection, bleeding, nerve injury, periprosthetic fracture, the need for revision surgery, dislocation, leg length discrepancy, blood clots, cardiopulmonary complications, morbidity, mortality, among others, and they were willing to proceed.  Predicted outcome is good, although there will be at least a six to nine month expected recovery.     SURGEON:  Deeann Saint, MD  ASST:    ANESTHESIA: Spinal    COMPLICATIONS:  None.   EBL: 100 cc    COMPONENTS:  Stryker Accolade Femoral Fracture stem size # 8,   and a size   51 mm  fracture head unipolar hip ball with    +4 mm  neck length.    PROCEDURE IN DETAIL: The patient was met in the holding area and identified.  The appropriate hip  was marked at the operative site. The patient was then transported to the OR and  placed under general anesthesia.  At that point, the patient was  placed in the lateral decubitus position with the operative side up and  secured to the operating room table and all bony prominences padded.     The operative lower extremity was prepped from the iliac crest to the toes.  Sterile draping was performed.  Time out was performed prior to incision.      A routine posterolateral approach was utilized via sharp dissection  carried down to the subcutaneous tissue.  Gross bleeders were Bovie  coagulated.  The iliotibial band was  identified and incised  along the length of the skin incision.  Self-retaining retractors were  inserted.  With the hip internally rotated, the short external rotators  were identified. The piriformis was tagged and the hip capsule released in a T-type fashion.  The femoral neck was exposed, and I resected the femoral neck using the appropriate jig. This was performed at approximately a thumb's breadth above the lesser trochanter.    I then exposed the deep acetabulum, cleared out any tissue including the ligamentum teres.    I then prepared the proximal femur using the cookie-cutter, the lateralizing reamer, and then sequentially broached.  A trial stem   was  utilized along with a unipolar head and neck.  I reduced the hip and it was found to have excellent stability with functional range of motion. Leg lengths were equal.  The trial components were then removed.   The same size Accolade femoral stem was then inserted and was very stable.  The Unitrax head and neck as trialed were inserted as well.     The hip was then reduced and taken through functional range of motion and found to have excellent stability. Leg lengths were restored.     I closed the T in the capsule with #2 Ticron as well as the short external rotators. A hemovac was inserted.    I then irrigated the hip copiously again with pulse lavage, and repaired the fascia with #2 Quill and the subcutaneous layer with #0 Quill.  Sponge and needle counts were correct. Dry sterile Aquacell was applied.   The patient was then awakened and returned to PACU in stable and satisfactory condition. There were no complications.  Valinda Hoar, MD Orthopedic Surgeon [phone]   10/08/2023 2:43 PM

## 2023-10-09 DIAGNOSIS — S72002A Fracture of unspecified part of neck of left femur, initial encounter for closed fracture: Secondary | ICD-10-CM | POA: Diagnosis not present

## 2023-10-09 LAB — BASIC METABOLIC PANEL
Anion gap: 5 (ref 5–15)
BUN: 19 mg/dL (ref 8–23)
CO2: 23 mmol/L (ref 22–32)
Calcium: 7.8 mg/dL — ABNORMAL LOW (ref 8.9–10.3)
Chloride: 106 mmol/L (ref 98–111)
Creatinine, Ser: 1.02 mg/dL (ref 0.61–1.24)
GFR, Estimated: >60 mL/min (ref >60)
Glucose, Bld: 108 mg/dL — ABNORMAL HIGH (ref 70–99)
Potassium: 3.6 mmol/L (ref 3.5–5.1)
Sodium: 134 mmol/L — ABNORMAL LOW (ref 135–145)

## 2023-10-09 LAB — CBC
HCT: 28.6 % — ABNORMAL LOW (ref 39.0–52.0)
Hemoglobin: 9.4 g/dL — ABNORMAL LOW (ref 13.0–17.0)
MCH: 29.8 pg (ref 26.0–34.0)
MCHC: 32.9 g/dL (ref 30.0–36.0)
MCV: 90.8 fL (ref 80.0–100.0)
Platelets: 194 10*3/uL (ref 150–400)
RBC: 3.15 MIL/uL — ABNORMAL LOW (ref 4.22–5.81)
RDW: 13.5 % (ref 11.5–15.5)
WBC: 11.4 10*3/uL — ABNORMAL HIGH (ref 4.0–10.5)
nRBC: 0 % (ref 0.0–0.2)

## 2023-10-09 LAB — GLUCOSE, CAPILLARY
Glucose-Capillary: 148 mg/dL — ABNORMAL HIGH (ref 70–99)
Glucose-Capillary: 216 mg/dL — ABNORMAL HIGH (ref 70–99)
Glucose-Capillary: 248 mg/dL — ABNORMAL HIGH (ref 70–99)
Glucose-Capillary: 136 mg/dL — ABNORMAL HIGH (ref 70–99)

## 2023-10-09 NOTE — Evaluation (Addendum)
 Physical Therapy Evaluation Patient Details Name: Troy Preston MRN: [id number] DOB: 11-18-1933 Today's Date: 10/09/2023  History of Present Illness  Patient is an 89 year old male sent from nursing home s/p fall with hip pain. PMH includes type II MD, dementia, possible Parkinson's disease, GERD, wears hearing aides. Patient underwent L hip hemiarthroplasty with Stryker Acolade prosthesis on 10/08/23.  Clinical Impression  Patient is a pleasantly confused 89 year old male. Prior to hospital admission, pt was utilizing a rollator and living at [facility]. Patient underwent L hip hemiarthroplasty on 10/08/23, he is WBAT. Upon PT entering room patient is pleasantly confused but agreeable to participate. Patient knows his name but is unaware of location, date, reason for being in hospital. Patient is able to follow commands but does not retain/carryover information for very long. He requires max A to get OOB and as transitioning he forgot why he was hurting and stopped requiring conversation of what had occurred prior to being able to finish transition. He is able to stand with Max A but upon standing forgot again why his hip was hurting and sat immediately. He required max A to remain standing due to not wanting to put weight on LLE. He was transitioned back to bed with Max A. CNA called to assist in re-positioning in bed. Patient's food delivered however patient unable to eat it without assistance first prepping the meal, cutting into pieces and setting all pieces in easy to reach areas. Nursing and CNA notified of need for assistance with eating. If [facility] able to provide necessary level of support, would benefit from return to familiar environment, routine and caregivers for optimal participation/carry-over of therapy; otherwise will requires SNF, will continue to monitor as patient progresses with rehab while in hospital. Pt would benefit from skilled PT to address noted impairments  and functional limitations (see below for any additional details).         If plan is discharge home, recommend the following: A lot of help with walking and/or transfers;A lot of help with bathing/dressing/bathroom;Assistance with cooking/housework;Assistance with feeding;Direct supervision/assist for medications management;Direct supervision/assist for financial management;Assist for transportation;Help with stairs or ramp for entrance;Supervision due to cognitive status   Can travel by private vehicle        Equipment Recommendations BSC/3in1  Recommendations for Other Services  OT consult    Functional Status Assessment Patient has had a recent decline in their functional status and demonstrates the ability to make significant improvements in function in a reasonable and predictable amount of time.     Precautions / Restrictions Precautions Precautions: Fall Recall of Precautions/Restrictions: Impaired Precaution/Restrictions Comments: patient has limited recall Restrictions Weight Bearing Restrictions Per Provider Order: Yes LLE Weight Bearing Per Provider Order: Weight bearing as tolerated Other Position/Activity Restrictions: no crossing limb, no >90      Mobility  Bed Mobility Overal bed mobility: Needs Assistance Bed Mobility: Supine to Sit, Sit to Supine     Supine to sit: HOB elevated, Max assist, Used rails Sit to supine: +2 for physical assistance, Max assist   General bed mobility comments: Patient able to follow commands, difficulty sequencing requires max A for getting to EOB    Transfers Overall transfer level: Needs assistance Equipment used: Rolling walker (2 wheels) Transfers: Sit to/from Stand Sit to Stand: Max assist, From elevated surface           General transfer comment: Patient performs STS with Max A , requires max A to remain in  standing.    Ambulation/Gait               General Gait Details: unable to side step or forward  step at this time.  Stairs            Wheelchair Mobility     Tilt Bed    Modified Rankin (Stroke Patients Only)       Balance Overall balance assessment: Needs assistance Sitting-balance support: Feet supported, Single extremity supported Sitting balance-Leahy Scale: Fair Sitting balance - Comments: able to sit EOB but pain limits duration   Standing balance support: Bilateral upper extremity supported, Reliant on assistive device for balance, During functional activity Standing balance-Leahy Scale: Poor Standing balance comment: Max A to remain upright due to pain in leg upon standing as well as patient confusion                             Pertinent Vitals/Pain Pain Assessment Pain Assessment: Faces Faces Pain Scale: Hurts even more Pain Location: L hip Pain Intervention(s): Limited activity within patient's tolerance, Monitored during session, Repositioned    Home Living Family/patient expects to be discharged to:: Other (Comment) (Patient coming from [facility].  If able to provide necessary level of support would benefit from return to familiar environment)                   Additional Comments: Patient presents from [facility]    Prior Function Prior Level of Function : Needs assist;Patient poor historian/Family not available  Cognitive Assist :  (patient has memory deficits at baseline)           Mobility Comments: uses rollator at baseline, increasing weakness of legs reported by patient ADLs Comments: patient poor historian, lives at [facility]     Extremity/Trunk Assessment   Upper Extremity Assessment Upper Extremity Assessment:  [facility])    Lower Extremity Assessment Lower Extremity Assessment: RLE deficits/detail;LLE deficits/detail RLE Deficits / Details: grossly 4-/5; difficulty with muscle testing due to discomfort sitting EOB RLE Sensation: WNL RLE Coordination: WNL LLE Deficits / Details:  deffered due to s/p surgery       Communication   Communication Communication: No apparent difficulties Factors Affecting Communication: Hearing impaired    Cognition Arousal: Alert Behavior During Therapy: WFL for tasks assessed/performed   PT - Cognitive impairments: History of cognitive impairments                       PT - Cognition Comments: Patient oriented to self, not oriented to location, reason for being at hospital, date, or year Following commands: Intact       Cueing Cueing Techniques: Verbal cues, Gestural cues, Tactile cues, Visual cues     General Comments General comments (skin integrity, edema, etc.): Patient appears well nourished and groomed.    Exercises Total Joint Exercises Ankle Circles/Pumps: Strengthening, AROM, 10 reps, Supine Quad Sets: Strengthening, Both, 10 reps, Supine Other Exercises Other Exercises: Patient educated on role of PT in acute care, safe mobility and transfers, precautions.   Assessment/Plan    PT Assessment Patient needs continued PT services  PT Problem List Decreased strength;Decreased balance;Decreased cognition;Decreased knowledge of precautions;Pain;Decreased mobility;Decreased knowledge of use of DME;Decreased safety awareness;Decreased activity tolerance       PT Treatment Interventions DME instruction;Gait training;Therapeutic exercise;Therapeutic activities;Functional mobility training;Balance training;Neuromuscular re-education;Cognitive remediation;Manual techniques;Patient/family education    PT Goals (Current goals can be found  in the Care Plan section)  Acute Rehab PT Goals Patient Stated Goal: to go back home ([facility]) PT Goal Formulation: With patient Time For Goal Achievement: 10/23/23 Potential to Achieve Goals: Fair    Frequency BID     Co-evaluation               AM-PAC PT "6 Clicks" Mobility  Outcome Measure Help needed turning from your back to your side while in a flat  bed without using bedrails?: A Lot Help needed moving from lying on your back to sitting on the side of a flat bed without using bedrails?: A Lot Help needed moving to and from a bed to a chair (including a wheelchair)?: A Lot Help needed standing up from a chair using your arms (e.g., wheelchair or bedside chair)?: A Lot Help needed to walk in hospital room?: Total Help needed climbing 3-5 steps with a railing? : Total 6 Click Score: 10    End of Session Equipment Utilized During Treatment: Gait belt Activity Tolerance: Patient limited by pain Patient left: in bed;with call bell/phone within reach;with bed alarm set Nurse Communication: Mobility status;Other (comment) (needs assistance with prepping meals) PT Visit Diagnosis: Unsteadiness on feet (R26.81);Other abnormalities of gait and mobility (R26.89);Muscle weakness (generalized) (M62.81);History of falling (Z91.81);Difficulty in walking, not elsewhere classified (R26.2);Pain Pain - Right/Left: Left Pain - part of body: Hip    Time: 1610-9604 PT Time Calculation (min) (ACUTE ONLY): 32 min   Charges:   PT Evaluation $PT Eval Moderate Complexity: 1 Mod PT Treatments $Therapeutic Activity: 8-22 mins PT General Charges $$ ACUTE PT VISIT: 1 Visit         Precious Bard, PT, DPT Physical Therapist - [place]   10/09/2023, 12:21 PM

## 2023-10-09 NOTE — Progress Notes (Signed)
 PROGRESS NOTE    Troy Preston  ZOX:[id number] DOB: August 21, 1933 DOA: 10/07/2023 PCP: Barbette Reichmann, MD  Assessment & Plan:   Principal Problem:   Hip fracture (HCC) Active Problems:   Femoral neck fracture (HCC)  Assessment and Plan: Left femoral neck fracture: s/p left hip hemiarthroplasty as per ortho surg. Norco, morphine prn for pain. Ortho surg following and recs apprec    DM2: well controlled, HbA1c 6.6. Continue on SSI w/ accuchecks   Likely ACD: likely secondary to DM2.  Will likely component of acute blood loss anemia secondary to above recent surg. No need for a transfusion currently   Dementia: continue w/ supportive care     DVT prophylaxis: lovenox Code Status: DNR Family Communication: discussed pt's care w/ pt's son and answered his questions  Disposition Plan: likely d/c to SNF  Level of care: Med-Surg  Status is: Inpatient Remains inpatient appropriate because: severity of illness, needs SNF placement     Consultants:  Ortho surg   Procedures:   Antimicrobials:    Subjective: Pt is pleasantly confused  Objective: Vitals:   10/08/23 1713 10/08/23 1937 10/09/23 0427 10/09/23 0809  BP: 112/62 (!) 104/49 (!) 115/56 114/61  Pulse: 70 68 66 73  Resp: 16 18 18 19   Temp: 98.1 F (36.7 C) (!) 97.4 F (36.3 C) (!) 97.5 F (36.4 C) 98 F (36.7 C)  TempSrc:  Oral Oral   SpO2: (!) 83% 96% 97% 96%  Weight:      Height:        Intake/Output Summary (Last 24 hours) at 10/09/2023 0858 Last data filed at 10/09/2023 8119 Gross per 24 hour  Intake 1646.91 ml  Output 700 ml  Net 946.91 ml   Filed Weights   10/07/23 1357  Weight: 63.5 kg    Examination:  General exam: Appears comfortable  Respiratory system: clear breath sounds b/l  Cardiovascular system: S1/S2+. No rubs or clicks   Gastrointestinal system: abd is soft, NT, ND & hypoactive bowel sounds  Central nervous system: Alert & awake. Moves all extremities  Psychiatry:  Judgement and insight appears poor     Data Reviewed: I have personally reviewed following labs and imaging studies  CBC: Recent Labs  Lab 10/07/23 1359 10/08/23 1635 10/09/23 0144  WBC 10.8* 12.9* 11.4*  HGB 11.2* 10.4* 9.4*  HCT 35.2* 31.5* 28.6*  MCV 95.1 92.9 90.8  PLT 248 204 194   Basic Metabolic Panel: Recent Labs  Lab 10/07/23 1359 10/08/23 1635 10/09/23 0144  NA 136  --  134*  K 3.7  --  3.6  CL 104  --  106  CO2 21*  --  23  GLUCOSE 314*  --  108*  BUN 21  --  19  CREATININE 1.20 1.00 1.02  CALCIUM 8.7*  --  7.8*   GFR: Estimated Creatinine Clearance: 45.8 mL/min (by C-G formula based on SCr of 1.02 mg/dL). Liver Function Tests: No results for input(s): "AST", "ALT", "ALKPHOS", "BILITOT", "PROT", "ALBUMIN" in the last 168 hours. No results for input(s): "LIPASE", "AMYLASE" in the last 168 hours. No results for input(s): "AMMONIA" in the last 168 hours. Coagulation Profile: Recent Labs  Lab 10/07/23 1707  INR 1.2   Cardiac Enzymes: No results for input(s): "CKTOTAL", "CKMB", "CKMBINDEX", "TROPONINI" in the last 168 hours. BNP (last 3 results) No results for input(s): "PROBNP" in the last 8760 hours. HbA1C: Recent Labs    10/07/23 1359  HGBA1C 6.6*   CBG: Recent Labs  Lab 10/08/23  1127 10/08/23 1436 10/08/23 1714 10/08/23 2034 10/09/23 0810  GLUCAP 96 119* 148* 209* 148*   Lipid Profile: No results for input(s): "CHOL", "HDL", "LDLCALC", "TRIG", "CHOLHDL", "LDLDIRECT" in the last 72 hours. Thyroid Function Tests: No results for input(s): "TSH", "T4TOTAL", "FREET4", "T3FREE", "THYROIDAB" in the last 72 hours. Anemia Panel: No results for input(s): "VITAMINB12", "FOLATE", "FERRITIN", "TIBC", "IRON", "RETICCTPCT" in the last 72 hours. Sepsis Labs: No results for input(s): "PROCALCITON", "LATICACIDVEN" in the last 168 hours.  No results found for this or any previous visit (from the past 240 hours).       Radiology Studies: DG HIP  UNILAT W OR W/O PELVIS 2-3 VIEWS LEFT Result Date: 10/08/2023 CLINICAL DATA:  Postoperative closed left hip fracture EXAM: DG HIP (WITH OR WITHOUT PELVIS) 2-3V LEFT COMPARISON:  10/07/2023 FINDINGS: Interval postoperative changes with placement of a left hip hemiarthroplasty using non cemented components. Components appear well seated. No evidence of new acute fracture or dislocation. Soft tissue gas and skin clips are consistent with recent surgery. Degenerative changes demonstrated in the lower lumbar spine and right hip. IMPRESSION: Interval left hip replacement. No acute complication is suggested radiographically. Electronically Signed   By: Burman Nieves M.D.   On: 10/08/2023 19:03   ECHOCARDIOGRAM COMPLETE Result Date: 10/08/2023    ECHOCARDIOGRAM REPORT   Patient Name:   Troy Preston Date of Exam: 10/08/2023 Medical Rec #:  [id number]       Height:       69.0 in Accession #:    [id number]      Weight:       140.0 lb Date of Birth:  05-03-1934      BSA:          1.775 m Patient Age:    89 years        BP:           140/65 mmHg Patient Gender: M               HR:           68 bpm. Exam Location:  [facility] Procedure: 2D Echo, Cardiac Doppler and Color Doppler (Both Spectral and Color            Flow Doppler were utilized during procedure). Indications:     Preoperative evaluation  History:         Patient has no prior history of Echocardiogram examinations.  Sonographer:     Elwin Sleight RDCS Referring Phys:  [id number] Emeline General Diagnosing Phys: Debbe Odea MD  Sonographer Comments: Suboptimal apical window and suboptimal subcostal window. Image acquisition challenging due to respiratory motion. IMPRESSIONS  1. Left ventricular ejection fraction, by estimation, is 60 to 65%. The left ventricle has normal function. The left ventricle has no regional wall motion abnormalities. Left ventricular diastolic parameters are consistent with Grade I diastolic dysfunction (impaired relaxation).  2. Right  ventricular systolic function is normal. The right ventricular size is normal.  3. The mitral valve is normal in structure. Trivial mitral valve regurgitation.  4. The aortic valve is tricuspid. Aortic valve regurgitation is mild. Aortic valve sclerosis/calcification is present, without any evidence of aortic stenosis.  5. The inferior vena cava is normal in size with greater than 50% respiratory variability, suggesting right atrial pressure of 3 mmHg. FINDINGS  Left Ventricle: Left ventricular ejection fraction, by estimation, is 60 to 65%. The left ventricle has normal function. The left ventricle has no regional wall motion abnormalities. Strain was performed  and the global longitudinal strain is indeterminate. The left ventricular internal cavity size was normal in size. There is no left ventricular hypertrophy. Left ventricular diastolic parameters are consistent with Grade I diastolic dysfunction (impaired relaxation). Right Ventricle: The right ventricular size is normal. No increase in right ventricular wall thickness. Right ventricular systolic function is normal. Left Atrium: Left atrial size was normal in size. Right Atrium: Right atrial size was normal in size. Pericardium: There is no evidence of pericardial effusion. Mitral Valve: The mitral valve is normal in structure. Trivial mitral valve regurgitation. Tricuspid Valve: The tricuspid valve is normal in structure. Tricuspid valve regurgitation is trivial. Aortic Valve: The aortic valve is tricuspid. Aortic valve regurgitation is mild. Aortic valve sclerosis/calcification is present, without any evidence of aortic stenosis. Aortic valve peak gradient measures 2.5 mmHg. Pulmonic Valve: The pulmonic valve was normal in structure. Pulmonic valve regurgitation is trivial. Aorta: The aortic root and ascending aorta are structurally normal, with no evidence of dilitation. Venous: The inferior vena cava is normal in size with greater than 50% respiratory  variability, suggesting right atrial pressure of 3 mmHg. IAS/Shunts: No atrial level shunt detected by color flow Doppler. Additional Comments: 3D was performed not requiring image post processing on an independent workstation and was indeterminate.  LEFT VENTRICLE PLAX 2D LVIDd:         3.90 cm   Diastology LVIDs:         2.80 cm   LV e' medial:    9.01 cm/s LV PW:         1.00 cm   LV E/e' medial:  6.8 LV IVS:        1.10 cm   LV e' lateral:   11.70 cm/s LVOT diam:     2.00 cm   LV E/e' lateral: 5.2 LV SV:         55 LV SV Index:   31 LVOT Area:     3.14 cm  RIGHT VENTRICLE RV Basal diam:  3.40 cm LEFT ATRIUM           Index        RIGHT ATRIUM           Index LA diam:      3.30 cm 1.86 cm/m   RA Area:     11.30 cm LA Vol (A4C): 31.8 ml 17.91 ml/m  RA Volume:   30.80 ml  17.35 ml/m  AORTIC VALVE                 PULMONIC VALVE AV Area (Vmax): 3.46 cm     PV Vmax:          0.87 m/s AV Vmax:        78.80 cm/s   PV Peak grad:     3.0 mmHg AV Peak Grad:   2.5 mmHg     PR End Diast Vel: 2.89 msec LVOT Vmax:      86.90 cm/s   RVOT Peak grad:   2 mmHg LVOT Vmean:     53.800 cm/s LVOT VTI:       0.176 m  AORTA Ao Root diam: 3.30 cm Ao Asc diam:  3.40 cm MITRAL VALVE               TRICUSPID VALVE MV Area (PHT): 3.89 cm    TR Peak grad:   41.2 mmHg MV Decel Time: 195 msec    TR Vmax:        321.00 cm/s  MV E velocity: 60.90 cm/s MV A velocity: 85.60 cm/s  SHUNTS MV E/A ratio:  0.71        Systemic VTI:  0.18 m                            Systemic Diam: 2.00 cm Debbe Odea MD Electronically signed by Debbe Odea MD Signature Date/Time: 10/08/2023/2:10:13 PM    Final    DG Chest Portable 1 View Result Date: 10/07/2023 CLINICAL DATA:  Status post fall, left hip fracture. EXAM: PORTABLE CHEST 1 VIEW COMPARISON:  None available. FINDINGS: Lung volumes are low. Upper normal heart size. Mild aortic atherosclerosis and tortuosity. No focal airspace disease, pulmonary edema, pleural effusion or pneumothorax. No  displaced rib fracture. IMPRESSION: Low lung volumes without acute chest findings. Electronically Signed   By: Narda Rutherford M.D.   On: 10/07/2023 15:57   DG HIP UNILAT WITH PELVIS 2-3 VIEWS LEFT Result Date: 10/07/2023 CLINICAL DATA:  Fall yesterday with left hip pain. EXAM: DG HIP (WITH OR WITHOUT PELVIS) 2-3V LEFT COMPARISON:  None Available. FINDINGS: Displaced left femoral neck fracture. Slight proximal migration of the femoral shaft. Apex anterior angulation. The femoral head remains seated. Bony pelvis is intact including pubic rami. No pubic symphyseal or sacroiliac diastasis. IMPRESSION: Displaced left femoral neck fracture. Electronically Signed   By: Narda Rutherford M.D.   On: 10/07/2023 15:55   CT HEAD WO CONTRAST ( ) Result Date: 10/07/2023 CLINICAL DATA:  Head trauma, minor (Age >= 65y); Neck trauma (Age >= 65y). Fall yesterday with head strike. EXAM: CT HEAD WITHOUT CONTRAST CT CERVICAL SPINE WITHOUT CONTRAST TECHNIQUE: Multidetector CT imaging of the head and cervical spine was performed following the standard protocol without intravenous contrast. Multiplanar CT image reconstructions of the cervical spine were also generated. RADIATION DOSE REDUCTION: This exam was performed according to the departmental dose-optimization program which includes automated exposure control, adjustment of the mA and/or kV according to patient size and/or use of iterative reconstruction technique. COMPARISON:  CT head and cervical spine 04/14/2023 FINDINGS: CT HEAD FINDINGS Brain: There is no evidence of an acute infarct, intracranial hemorrhage, mass, midline shift, or extra-axial fluid collection. There is unchanged mild cerebral atrophy. Cerebral white matter hypodensities are similar to the prior CT and are nonspecific but compatible with mild chronic small vessel ischemic disease. Vascular: No hyperdense vessel. Skull: No acute fracture or suspicious lesion. Sinuses/Orbits: Chronic posterior left  ethmoid air cell opacification. New small to moderate volume fluid in the left sphenoid and right maxillary sinuses with mild mucosal thickening in the latter as well. Increased, large volume fluid and mucosal thickening resulting in near complete opacification of the left maxillary sinus. Clear mastoid air cells. Bilateral cataract extraction. Other: None. CT CERVICAL SPINE FINDINGS Alignment: Normal. Skull base and vertebrae: No acute fracture or suspicious lesion. Soft tissues and spinal canal: No prevertebral fluid or swelling. No visible canal hematoma. Disc levels: No significant disc degeneration for age. Moderately advanced facet arthrosis in the mid and upper cervical spine, overall worse on the right. Upper chest: Biapical lung scarring. Other: None. IMPRESSION: 1. No evidence of acute intracranial abnormality or cervical spine fracture. 2. Mild chronic small vessel ischemic disease. 3. Sinusitis, including near complete opacification of the left maxillary sinus by fluid. Correlate for acute sinusitis. Electronically Signed   By: Sebastian Ache M.D.   On: 10/07/2023 15:15   CT Cervical Spine Wo Contrast Result Date: 10/07/2023 CLINICAL DATA:  Head trauma, minor (Age >= 65y); Neck trauma (Age >= 65y). Fall yesterday with head strike. EXAM: CT HEAD WITHOUT CONTRAST CT CERVICAL SPINE WITHOUT CONTRAST TECHNIQUE: Multidetector CT imaging of the head and cervical spine was performed following the standard protocol without intravenous contrast. Multiplanar CT image reconstructions of the cervical spine were also generated. RADIATION DOSE REDUCTION: This exam was performed according to the departmental dose-optimization program which includes automated exposure control, adjustment of the mA and/or kV according to patient size and/or use of iterative reconstruction technique. COMPARISON:  CT head and cervical spine 04/14/2023 FINDINGS: CT HEAD FINDINGS Brain: There is no evidence of an acute infarct, intracranial  hemorrhage, mass, midline shift, or extra-axial fluid collection. There is unchanged mild cerebral atrophy. Cerebral white matter hypodensities are similar to the prior CT and are nonspecific but compatible with mild chronic small vessel ischemic disease. Vascular: No hyperdense vessel. Skull: No acute fracture or suspicious lesion. Sinuses/Orbits: Chronic posterior left ethmoid air cell opacification. New small to moderate volume fluid in the left sphenoid and right maxillary sinuses with mild mucosal thickening in the latter as well. Increased, large volume fluid and mucosal thickening resulting in near complete opacification of the left maxillary sinus. Clear mastoid air cells. Bilateral cataract extraction. Other: None. CT CERVICAL SPINE FINDINGS Alignment: Normal. Skull base and vertebrae: No acute fracture or suspicious lesion. Soft tissues and spinal canal: No prevertebral fluid or swelling. No visible canal hematoma. Disc levels: No significant disc degeneration for age. Moderately advanced facet arthrosis in the mid and upper cervical spine, overall worse on the right. Upper chest: Biapical lung scarring. Other: None. IMPRESSION: 1. No evidence of acute intracranial abnormality or cervical spine fracture. 2. Mild chronic small vessel ischemic disease. 3. Sinusitis, including near complete opacification of the left maxillary sinus by fluid. Correlate for acute sinusitis. Electronically Signed   By: Sebastian Ache M.D.   On: 10/07/2023 15:15        Scheduled Meds:  celecoxib  200 mg Oral BID   docusate sodium  100 mg Oral BID   enoxaparin (LOVENOX) injection  30 mg Subcutaneous Q24H   ferrous sulfate  325 mg Oral Q breakfast   gabapentin  200 mg Oral BID   insulin aspart  0-15 Units Subcutaneous TID WC   insulin aspart  0-5 Units Subcutaneous QHS   metoprolol tartrate  12.5 mg Oral BID   pantoprazole  40 mg Oral Daily   Continuous Infusions:  sodium chloride 75 mL/hr at 10/09/23 6045    sodium chloride 75 mL/hr at 10/08/23 1155    ceFAZolin (ANCEF) IV Stopped (10/09/23 0257)     LOS: 2 days       Charise Killian, MD [facility] Pager [phone]  If 7PM-7AM, please contact night-coverage 10/09/2023, 8:58 AM

## 2023-10-09 NOTE — Progress Notes (Signed)
 Subjective: 1 Day Post-Op Procedure(s) (LRB): HEMIARTHROPLASTY (BIPOLAR) HIP, POSTERIOR APPROACH FOR FRACTURE (Left) Patient is awake and alert.  He has slight confusion.  He is not not in any significant pain he says.  Hemoglobin is stable.  The hip is stable.  Dressing is dry.  Patient reports pain as mild.  Objective:   VITALS:   Vitals:   10/09/23 0809 10/09/23 1810  BP: 114/61 (!) 113/59  Pulse: 73 69  Resp: 19 20  Temp: 98 F (36.7 C) (!) 97.3 F (36.3 C)  SpO2: 96% 97%    Neurologically intact Neurovascular intact Incision: dressing C/D/I  LABS Recent Labs    10/07/23 1359 10/08/23 1635 10/09/23 0144  HGB 11.2* 10.4* 9.4*  HCT 35.2* 31.5* 28.6*  WBC 10.8* 12.9* 11.4*  PLT 248 204 194    Recent Labs    10/07/23 1359 10/08/23 1635 10/09/23 0144  NA 136  --  134*  K 3.7  --  3.6  BUN 21  --  19  CREATININE 1.20 1.00 1.02  GLUCOSE 314*  --  108*    Recent Labs    10/07/23 1707  INR 1.2     Assessment/Plan: 1 Day Post-Op Procedure(s) (LRB): HEMIARTHROPLASTY (BIPOLAR) HIP, POSTERIOR APPROACH FOR FRACTURE (Left)   Advance diet Up with therapy Discharge to SNF when medically stabilized Return to office 2 weeks after discharge for x-rays 81 mg ASA twice daily for 6 weeks upon discharge for DVT prophylaxis

## 2023-10-09 NOTE — Plan of Care (Signed)
  Problem: Education: Goal: Ability to describe self-care measures that may prevent or decrease complications (Diabetes Survival Skills Education) will improve Outcome: Progressing Goal: Individualized Educational Video(s) Outcome: Progressing   Problem: Coping: Goal: Ability to adjust to condition or change in health will improve Outcome: Progressing   Problem: Fluid Volume: Goal: Ability to maintain a balanced intake and output will improve Outcome: Progressing   Problem: Health Behavior/Discharge Planning: Goal: Ability to identify and utilize available resources and services will improve Outcome: Progressing Goal: Ability to manage health-related needs will improve Outcome: Progressing   Problem: Metabolic: Goal: Ability to maintain appropriate glucose levels will improve Outcome: Progressing   Problem: Nutritional: Goal: Maintenance of adequate nutrition will improve Outcome: Progressing Goal: Progress toward achieving an optimal weight will improve Outcome: Progressing   Problem: Skin Integrity: Goal: Risk for impaired skin integrity will decrease Outcome: Progressing   Problem: Tissue Perfusion: Goal: Adequacy of tissue perfusion will improve Outcome: Progressing   Problem: Education: Goal: Knowledge of General Education information will improve Description: Including pain rating scale, medication(s)/side effects and non-pharmacologic comfort measures Outcome: Progressing   Problem: Health Behavior/Discharge Planning: Goal: Ability to manage health-related needs will improve Outcome: Progressing   Problem: Clinical Measurements: Goal: Ability to maintain clinical measurements within normal limits will improve Outcome: Progressing Goal: Will remain free from infection Outcome: Progressing Goal: Diagnostic test results will improve Outcome: Progressing Goal: Respiratory complications will improve Outcome: Progressing Goal: Cardiovascular complication will  be avoided Outcome: Progressing   Problem: Activity: Goal: Risk for activity intolerance will decrease Outcome: Progressing   Problem: Nutrition: Goal: Adequate nutrition will be maintained Outcome: Progressing   Problem: Coping: Goal: Level of anxiety will decrease Outcome: Progressing   Problem: Elimination: Goal: Will not experience complications related to bowel motility Outcome: Progressing Goal: Will not experience complications related to urinary retention Outcome: Progressing   Problem: Pain Managment: Goal: General experience of comfort will improve and/or be controlled Outcome: Progressing   Problem: Safety: Goal: Ability to remain free from injury will improve Outcome: Progressing   Problem: Skin Integrity: Goal: Risk for impaired skin integrity will decrease Outcome: Progressing   Problem: Education: Goal: Verbalization of understanding the information provided (i.e., activity precautions, restrictions, etc) will improve Outcome: Progressing Goal: Individualized Educational Video(s) Outcome: Progressing   Problem: Activity: Goal: Ability to ambulate and perform ADLs will improve Outcome: Progressing   Problem: Clinical Measurements: Goal: Postoperative complications will be avoided or minimized Outcome: Progressing   Problem: Self-Concept: Goal: Ability to maintain and perform role responsibilities to the fullest extent possible will improve Outcome: Progressing   Problem: Pain Management: Goal: Pain level will decrease Outcome: Progressing

## 2023-10-10 ENCOUNTER — Encounter: Payer: Self-pay | Admitting: Specialist

## 2023-10-10 DIAGNOSIS — S72002A Fracture of unspecified part of neck of left femur, initial encounter for closed fracture: Secondary | ICD-10-CM | POA: Diagnosis not present

## 2023-10-10 LAB — GLUCOSE, CAPILLARY
Glucose-Capillary: 186 mg/dL — ABNORMAL HIGH (ref 70–99)
Glucose-Capillary: 192 mg/dL — ABNORMAL HIGH (ref 70–99)
Glucose-Capillary: 188 mg/dL — ABNORMAL HIGH (ref 70–99)
Glucose-Capillary: 170 mg/dL — ABNORMAL HIGH (ref 70–99)
Glucose-Capillary: 178 mg/dL — ABNORMAL HIGH (ref 70–99)

## 2023-10-10 LAB — CBC
HCT: 27.5 % — ABNORMAL LOW (ref 39.0–52.0)
Hemoglobin: 9.3 g/dL — ABNORMAL LOW (ref 13.0–17.0)
MCH: 30.4 pg (ref 26.0–34.0)
MCHC: 33.8 g/dL (ref 30.0–36.0)
MCV: 89.9 fL (ref 80.0–100.0)
Platelets: 186 10*3/uL (ref 150–400)
RBC: 3.06 MIL/uL — ABNORMAL LOW (ref 4.22–5.81)
RDW: 13.4 % (ref 11.5–15.5)
WBC: 9.9 10*3/uL (ref 4.0–10.5)
nRBC: 0 % (ref 0.0–0.2)

## 2023-10-10 LAB — BASIC METABOLIC PANEL
Anion gap: 9 (ref 5–15)
BUN: 19 mg/dL (ref 8–23)
CO2: 20 mmol/L — ABNORMAL LOW (ref 22–32)
Calcium: 8.1 mg/dL — ABNORMAL LOW (ref 8.9–10.3)
Chloride: 105 mmol/L (ref 98–111)
Creatinine, Ser: 1.16 mg/dL (ref 0.61–1.24)
GFR, Estimated: >60 mL/min (ref >60)
Glucose, Bld: 210 mg/dL — ABNORMAL HIGH (ref 70–99)
Potassium: 3.9 mmol/L (ref 3.5–5.1)
Sodium: 134 mmol/L — ABNORMAL LOW (ref 135–145)

## 2023-10-10 NOTE — Progress Notes (Signed)
 Subjective: 2 Days Post-Op Procedure(s) (LRB): HEMIARTHROPLASTY (BIPOLAR) HIP, POSTERIOR APPROACH FOR FRACTURE (Left) Patient is awake and alert in bed.  Abduction pillow is in place.  Dressing is dry.  The hip is stable and aligned well.  Neurovascular status is good.  Patient reports pain as mild.  Objective:   VITALS:   Vitals:   10/10/23 0501 10/10/23 0738  BP: (!) 120/59 138/80  Pulse: 65 80  Resp: 18 18  Temp: 97.6 F (36.4 C) 98.1 F (36.7 C)  SpO2: 90% 99%    Neurologically intact Neurovascular intact Incision: dressing C/D/I  LABS Recent Labs    10/08/23 1635 10/09/23 0144 10/10/23 0609  HGB 10.4* 9.4* 9.3*  HCT 31.5* 28.6* 27.5*  WBC 12.9* 11.4* 9.9  PLT 204 194 186    Recent Labs    10/07/23 1359 10/08/23 1635 10/09/23 0144 10/10/23 0609  NA 136  --  134* 134*  K 3.7  --  3.6 3.9  BUN 21  --  19 19  CREATININE 1.20 1.00 1.02 1.16  GLUCOSE 314*  --  108* 210*    Recent Labs    10/07/23 1707  INR 1.2     Assessment/Plan: 2 Days Post-Op Procedure(s) (LRB): HEMIARTHROPLASTY (BIPOLAR) HIP, POSTERIOR APPROACH FOR FRACTURE (Left)   Advance diet Up with therapy Discharge to SNF when appropriate 81 mg aspirin twice daily for 8 weeks postdischarge Return to my office in 2 weeks after discharge for x-rays Weightbearing as tolerated with a walker

## 2023-10-10 NOTE — Evaluation (Signed)
 Occupational Therapy Evaluation Patient Details Name: Troy Preston MRN: [id number] DOB: [birth year] Today's Date: 10/10/2023   History of Present Illness   Patient is an 89 year old male sent from nursing home s/p fall with hip pain. PMH includes type II MD, dementia, possible Parkinson's disease, GERD, wears hearing aides. Patient underwent L hip hemiarthroplasty on 10/08/23.   Clinical Impressions Troy Preston was seen for OT evaluation this date. Pt is poor historian, per chart pt resident at ALF, ambulatory with [phone]. Pt currently requires MAX A x2 sup>sit. MAX A x2 + RW for 3 standing trials - attempted marching with BLE shaking and knees buckling. Poor sitting balance with posterior lean static sitting. Pt would benefit from skilled OT to address noted impairments and functional limitations (see below for any additional details). Upon hospital discharge, recommend OT follow up <3 hours/day.   If plan is discharge home, recommend the following:   Two people to help with walking and/or transfers;Two people to help with bathing/dressing/bathroom;Help with stairs or ramp for entrance;Supervision due to cognitive status     Functional Status Assessment   Patient has had a recent decline in their functional status and demonstrates the ability to make significant improvements in function in a reasonable and predictable amount of time.     Equipment Recommendations   Other (comment) (defer)     Recommendations for Other Services         Precautions/Restrictions   Precautions Precautions: Fall;Posterior Hip Recall of Precautions/Restrictions: Impaired Precaution/Restrictions Comments: patient has limited recall Restrictions Weight Bearing Restrictions Per Provider Order: Yes LLE Weight Bearing Per Provider Order: Weight bearing as tolerated     Mobility Bed Mobility Overal bed mobility: Needs Assistance Bed Mobility: Supine to Sit, Sit to Supine, Rolling Rolling: Max  assist   Supine to sit: Max assist, +2 for physical assistance Sit to supine: +2 for physical assistance, Total assist        Transfers Overall transfer level: Needs assistance Equipment used: Rolling walker (2 wheels) Transfers: Sit to/from Stand Sit to Stand: Max assist, +2 physical assistance           General transfer comment: attempted marching with BLE shaking and knees buckling.       Balance Overall balance assessment: Needs assistance Sitting-balance support: Feet supported, Single extremity supported Sitting balance-Leahy Scale: Poor   Postural control: Posterior lean Standing balance support: Bilateral upper extremity supported, Reliant on assistive device for balance, During functional activity Standing balance-Leahy Scale: Poor                             ADL either performed or assessed with clinical judgement   ADL Overall ADL's : Needs assistance/impaired                                       General ADL Comments: MAX A x2 + RW for standing toileting                  Pertinent Vitals/Pain Pain Assessment Pain Assessment: Faces Faces Pain Scale: Hurts whole lot Pain Location: L hip Pain Descriptors / Indicators: Aching, Discomfort, Grimacing, Operative site guarding Pain Intervention(s): Limited activity within patient's tolerance, Repositioned     Extremity/Trunk Assessment Upper Extremity Assessment Upper Extremity Assessment: Generalized weakness   Lower Extremity Assessment Lower Extremity Assessment: Generalized weakness  Communication Communication Communication: No apparent difficulties Factors Affecting Communication: Hearing impaired   Cognition Arousal: Alert Behavior During Therapy: WFL for tasks assessed/performed Cognition: History of cognitive impairments                               Following commands: Intact       Cueing  General Comments   Cueing Techniques:  Verbal cues;Gestural cues;Tactile cues;Visual cues      Exercises     Shoulder Instructions      Home Living Family/patient expects to be discharged to:: Assisted living                             Home Equipment: Rollator (4 wheels)   Additional Comments: [place]      Prior Functioning/Environment Prior Level of Function : Needs assist;Patient poor historian/Family not available             Mobility Comments: uses rollator at baseline, increasing weakness of legs reported by patient      OT Problem List: Decreased strength;Decreased range of motion;Decreased activity tolerance;Impaired balance (sitting and/or standing)   OT Treatment/Interventions: Self-care/ADL training;Therapeutic exercise;Energy conservation;DME and/or AE instruction;Therapeutic activities      OT Goals(Current goals can be found in the care plan section)   Acute Rehab OT Goals Patient Stated Goal: to go home OT Goal Formulation: With patient Time For Goal Achievement: 10/24/23 Potential to Achieve Goals: Fair ADL Goals Pt Will Perform Grooming: sitting;with set-up;with supervision Pt Will Perform Lower Body Dressing: with mod assist;with caregiver independent in assisting;sit to/from stand Pt Will Transfer to Toilet: with mod assist;ambulating;bedside commode   OT Frequency:  Min 2X/week    Co-evaluation PT/OT/SLP Co-Evaluation/Treatment: Yes Reason for Co-Treatment: Necessary to address cognition/behavior during functional activity;For patient/therapist safety;To address functional/ADL transfers PT goals addressed during session: Mobility/safety with mobility OT goals addressed during session: ADL's and self-care      AM-PAC OT "6 Clicks" Daily Activity     Outcome Measure Help from another person eating meals?: None Help from another person taking care of personal grooming?: A Little Help from another person toileting, which includes using toliet, bedpan, or  urinal?: A Lot Help from another person bathing (including washing, rinsing, drying)?: A Lot Help from another person to put on and taking off regular upper body clothing?: A Lot Help from another person to put on and taking off regular lower body clothing?: A Lot 6 Click Score: 15   End of Session Equipment Utilized During Treatment: Rolling walker (2 wheels)  Activity Tolerance: Patient tolerated treatment well Patient left: in bed;with call bell/phone within reach;with bed alarm set  OT Visit Diagnosis: Other abnormalities of gait and mobility (R26.89);Muscle weakness (generalized) (M62.81)                Time: 9604-5409 OT Time Calculation (min): 15 min Charges:  OT General Charges $OT Visit: 1 Visit OT Evaluation $OT Eval Moderate Complexity: 1 Mod  Kathie Dike, M.S. OTR/L  10/10/23, 1:03 PM  ascom [phone]

## 2023-10-10 NOTE — Care Management Important Message (Signed)
 Important Message  Patient Details  Name: Troy Preston MRN: [id number] Date of Birth: Dec 03, 1933   Important Message Given:  Yes - Medicare IM     Cristela Blue, CMA 10/10/2023, 12:39 PM

## 2023-10-10 NOTE — Progress Notes (Signed)
 Physical Therapy Treatment Patient Details Name: Troy Preston MRN: [id number] DOB: 06-May-1934 Today's Date: 10/10/2023   History of Present Illness Patient is an 89 year old male sent from nursing home s/p fall with hip pain. PMH includes type II MD, dementia, possible Parkinson's disease, GERD, wears hearing aides. Patient underwent L hip hemiarthroplasty on 10/08/23.    PT Comments  Pt received in Semi-Fowler's position and agreeable to therapy.  Pt required +2 max A to come into seated position from supine and again required maxA +2 to come into standing with use of FWW.  Pt performed STS x3 different trials and when attempting to march, pt noted to have buckling of the knees and difficulty with remaining standing.  Pt has posterior bias in standing and in sitting and required assistance to be placed in bed at conclusion of the session.  Updated recommendations and frequency at this time due to cognition and inability to progress at this time.   If plan is discharge home, recommend the following: A lot of help with walking and/or transfers;A lot of help with bathing/dressing/bathroom;Assistance with cooking/housework;Assistance with feeding;Direct supervision/assist for medications management;Direct supervision/assist for financial management;Assist for transportation;Help with stairs or ramp for entrance;Supervision due to cognitive status   Can travel by private vehicle     No  Equipment Recommendations       Recommendations for Other Services       Precautions / Restrictions Precautions Precautions: Fall;Posterior Hip Recall of Precautions/Restrictions: Impaired Precaution/Restrictions Comments: patient has limited recall Restrictions Weight Bearing Restrictions Per Provider Order: Yes LLE Weight Bearing Per Provider Order: Weight bearing as tolerated     Mobility  Bed Mobility Overal bed mobility: Needs Assistance Bed Mobility: Supine to Sit, Sit to Supine,  Rolling Rolling: Max assist   Supine to sit: Max assist, +2 for physical assistance Sit to supine: +2 for physical assistance, Total assist        Transfers Overall transfer level: Needs assistance Equipment used: Rolling walker (2 wheels) Transfers: Sit to/from Stand Sit to Stand: Max assist, +2 physical assistance           General transfer comment: attempted marching with BLE shaking and knees buckling    Ambulation/Gait               General Gait Details: unable to side step or forward step at this time.   Stairs             Wheelchair Mobility     Tilt Bed    Modified Rankin (Stroke Patients Only)       Balance Overall balance assessment: Needs assistance Sitting-balance support: Feet supported, Single extremity supported Sitting balance-Leahy Scale: Poor   Postural control: Posterior lean Standing balance support: Bilateral upper extremity supported, Reliant on assistive device for balance, During functional activity Standing balance-Leahy Scale: Poor                              Communication Communication Communication: No apparent difficulties Factors Affecting Communication: Hearing impaired  Cognition Arousal: Alert Behavior During Therapy: WFL for tasks assessed/performed                             Following commands: Intact      Cueing Cueing Techniques: Verbal cues, Gestural cues, Tactile cues, Visual cues  Exercises      General Comments  Pertinent Vitals/Pain Pain Assessment Pain Assessment: Faces Faces Pain Scale: Hurts whole lot Pain Location: L hip Pain Descriptors / Indicators: Aching, Discomfort, Grimacing, Operative site guarding Pain Intervention(s): Limited activity within patient's tolerance, Repositioned    Home Living Family/patient expects to be discharged to:: Assisted living                 Home Equipment: Rollator (4 wheels) Additional Comments: [employer]     Prior Function            PT Goals (current goals can now be found in the care plan section) Acute Rehab PT Goals Patient Stated Goal: to go back home ([facility]) PT Goal Formulation: With patient Time For Goal Achievement: 10/23/23 Potential to Achieve Goals: Fair Progress towards PT goals: Progressing toward goals    Frequency    7X/week      PT Plan      Co-evaluation   Reason for Co-Treatment: Necessary to address cognition/behavior during functional activity;For patient/therapist safety;To address functional/ADL transfers PT goals addressed during session: Mobility/safety with mobility OT goals addressed during session: ADL's and self-care      AM-PAC PT "6 Clicks" Mobility   Outcome Measure  Help needed turning from your back to your side while in a flat bed without using bedrails?: A Lot Help needed moving from lying on your back to sitting on the side of a flat bed without using bedrails?: A Lot Help needed moving to and from a bed to a chair (including a wheelchair)?: A Lot Help needed standing up from a chair using your arms (e.g., wheelchair or bedside chair)?: A Lot Help needed to walk in hospital room?: Total Help needed climbing 3-5 steps with a railing? : Total 6 Click Score: 10    End of Session Equipment Utilized During Treatment: Gait belt Activity Tolerance: Patient limited by pain Patient left: in bed;with call bell/phone within reach;with bed alarm set Nurse Communication: Mobility status;Other (comment) PT Visit Diagnosis: Unsteadiness on feet (R26.81);Other abnormalities of gait and mobility (R26.89);Muscle weakness (generalized) (M62.81);History of falling (Z91.81);Difficulty in walking, not elsewhere classified (R26.2);Pain Pain - Right/Left: Left Pain - part of body: Hip     Time: 1610-9604 PT Time Calculation (min) (ACUTE ONLY): 31 min  Charges:    $Therapeutic Activity: 23-37 mins PT General Charges $$ ACUTE PT VISIT: 1  Visit                     Nolon Bussing, PT, DPT Physical Therapist - [facility]  10/10/23, 1:39 PM

## 2023-10-10 NOTE — Progress Notes (Signed)
 PROGRESS NOTE    Troy Preston  EXB:[id number] DOB: Jul 30, 1933 DOA: 10/07/2023 PCP: Barbette Reichmann, MD  Assessment & Plan:   Principal Problem:   Hip fracture (HCC) Active Problems:   Femoral neck fracture (HCC)  Assessment and Plan: Left femoral neck fracture: s/p left hip hemiarthroplasty as per ortho surg. Norco, morphine prn for pain. PT/OT recs SNF. Ortho surg following and recs apprec     DM2: well controlled, HbA1c 6.6. Continue on SSI w/ accuchecks   Likely ACD: likely secondary to DM2.  Will likely component of acute blood loss anemia secondary to above recent surg. Will transfuse if Hb < 7.0   Dementia: continue w/ supportive care      DVT prophylaxis: lovenox Code Status: DNR Family Communication:  Disposition Plan: likely d/c to SNF  Level of care: Med-Surg  Status is: Inpatient Remains inpatient appropriate because: medically stable. Waiting on SNF placement      Consultants:  Ortho surg   Procedures:   Antimicrobials:    Subjective: Pt is pleasantly confused   Objective: Vitals:   10/09/23 1810 10/09/23 2056 10/10/23 0501 10/10/23 0738  BP: (!) 113/59 (!) 106/58 (!) 120/59 138/80  Pulse: 69 71 65 80  Resp: 20 17 18 18   Temp: (!) 97.3 F (36.3 C) 99 F (37.2 C) 97.6 F (36.4 C) 98.1 F (36.7 C)  TempSrc:   Oral Oral  SpO2: 97% 99% 90% 99%  Weight:      Height:        Intake/Output Summary (Last 24 hours) at 10/10/2023 0858 Last data filed at 10/10/2023 0500 Gross per 24 hour  Intake --  Output 1050 ml  Net -1050 ml   Filed Weights   10/07/23 1357  Weight: 63.5 kg    Examination:  General exam: Appears calm, comfortable but confused  Respiratory system: clear breath sounds b/l  Cardiovascular system: S1 & S2+. No rubs or gallops  Gastrointestinal system: abd is soft, NT, ND & hypoactive bowel sounds  Central nervous system: Alert & awake. Moves all extremities  Psychiatry: Judgement and insight appears poor. Flat mood  and affect    Data Reviewed: I have personally reviewed following labs and imaging studies  CBC: Recent Labs  Lab 10/07/23 1359 10/08/23 1635 10/09/23 0144 10/10/23 0609  WBC 10.8* 12.9* 11.4* 9.9  HGB 11.2* 10.4* 9.4* 9.3*  HCT 35.2* 31.5* 28.6* 27.5*  MCV 95.1 92.9 90.8 89.9  PLT 248 204 194 186   Basic Metabolic Panel: Recent Labs  Lab 10/07/23 1359 10/08/23 1635 10/09/23 0144 10/10/23 0609  NA 136  --  134* 134*  K 3.7  --  3.6 3.9  CL 104  --  106 105  CO2 21*  --  23 20*  GLUCOSE 314*  --  108* 210*  BUN 21  --  19 19  CREATININE 1.20 1.00 1.02 1.16  CALCIUM 8.7*  --  7.8* 8.1*   GFR: Estimated Creatinine Clearance: 40.3 mL/min (by C-G formula based on SCr of 1.16 mg/dL). Liver Function Tests: No results for input(s): "AST", "ALT", "ALKPHOS", "BILITOT", "PROT", "ALBUMIN" in the last 168 hours. No results for input(s): "LIPASE", "AMYLASE" in the last 168 hours. No results for input(s): "AMMONIA" in the last 168 hours. Coagulation Profile: Recent Labs  Lab 10/07/23 1707  INR 1.2   Cardiac Enzymes: No results for input(s): "CKTOTAL", "CKMB", "CKMBINDEX", "TROPONINI" in the last 168 hours. BNP (last 3 results) No results for input(s): "PROBNP" in the last 8760 hours.  HbA1C: Recent Labs    10/07/23 1359  HGBA1C 6.6*   CBG: Recent Labs  Lab 10/09/23 0810 10/09/23 1128 10/09/23 1650 10/09/23 2050 10/10/23 0802  GLUCAP 148* 216* 248* 136* 186*   Lipid Profile: No results for input(s): "CHOL", "HDL", "LDLCALC", "TRIG", "CHOLHDL", "LDLDIRECT" in the last 72 hours. Thyroid Function Tests: No results for input(s): "TSH", "T4TOTAL", "FREET4", "T3FREE", "THYROIDAB" in the last 72 hours. Anemia Panel: No results for input(s): "VITAMINB12", "FOLATE", "FERRITIN", "TIBC", "IRON", "RETICCTPCT" in the last 72 hours. Sepsis Labs: No results for input(s): "PROCALCITON", "LATICACIDVEN" in the last 168 hours.  No results found for this or any previous visit  (from the past 240 hours).       Radiology Studies: DG HIP UNILAT W OR W/O PELVIS 2-3 VIEWS LEFT Result Date: 10/08/2023 CLINICAL DATA:  Postoperative closed left hip fracture EXAM: DG HIP (WITH OR WITHOUT PELVIS) 2-3V LEFT COMPARISON:  10/07/2023 FINDINGS: Interval postoperative changes with placement of a left hip hemiarthroplasty using non cemented components. Components appear well seated. No evidence of new acute fracture or dislocation. Soft tissue gas and skin clips are consistent with recent surgery. Degenerative changes demonstrated in the lower lumbar spine and right hip. IMPRESSION: Interval left hip replacement. No acute complication is suggested radiographically. Electronically Signed   By: Burman Nieves M.D.   On: 10/08/2023 19:03   ECHOCARDIOGRAM COMPLETE Result Date: 10/08/2023    ECHOCARDIOGRAM REPORT   Patient Name:   Troy Preston Date of Exam: 10/08/2023 Medical Rec #:  [id number]       Height:       69.0 in Accession #:    [id number]      Weight:       140.0 lb Date of Birth:  1933-08-03      BSA:          1.775 m Patient Age:    89 years        BP:           140/65 mmHg Patient Gender: M               HR:           68 bpm. Exam Location:  [facility] Procedure: 2D Echo, Cardiac Doppler and Color Doppler (Both Spectral and Color            Flow Doppler were utilized during procedure). Indications:     Preoperative evaluation  History:         Patient has no prior history of Echocardiogram examinations.  Sonographer:     Elwin Sleight RDCS Referring Phys:  [id number] Emeline General Diagnosing Phys: Debbe Odea MD  Sonographer Comments: Suboptimal apical window and suboptimal subcostal window. Image acquisition challenging due to respiratory motion. IMPRESSIONS  1. Left ventricular ejection fraction, by estimation, is 60 to 65%. The left ventricle has normal function. The left ventricle has no regional wall motion abnormalities. Left ventricular diastolic parameters are consistent with  Grade I diastolic dysfunction (impaired relaxation).  2. Right ventricular systolic function is normal. The right ventricular size is normal.  3. The mitral valve is normal in structure. Trivial mitral valve regurgitation.  4. The aortic valve is tricuspid. Aortic valve regurgitation is mild. Aortic valve sclerosis/calcification is present, without any evidence of aortic stenosis.  5. The inferior vena cava is normal in size with greater than 50% respiratory variability, suggesting right atrial pressure of 3 mmHg. FINDINGS  Left Ventricle: Left ventricular ejection fraction, by estimation, is 60 to  65%. The left ventricle has normal function. The left ventricle has no regional wall motion abnormalities. Strain was performed and the global longitudinal strain is indeterminate. The left ventricular internal cavity size was normal in size. There is no left ventricular hypertrophy. Left ventricular diastolic parameters are consistent with Grade I diastolic dysfunction (impaired relaxation). Right Ventricle: The right ventricular size is normal. No increase in right ventricular wall thickness. Right ventricular systolic function is normal. Left Atrium: Left atrial size was normal in size. Right Atrium: Right atrial size was normal in size. Pericardium: There is no evidence of pericardial effusion. Mitral Valve: The mitral valve is normal in structure. Trivial mitral valve regurgitation. Tricuspid Valve: The tricuspid valve is normal in structure. Tricuspid valve regurgitation is trivial. Aortic Valve: The aortic valve is tricuspid. Aortic valve regurgitation is mild. Aortic valve sclerosis/calcification is present, without any evidence of aortic stenosis. Aortic valve peak gradient measures 2.5 mmHg. Pulmonic Valve: The pulmonic valve was normal in structure. Pulmonic valve regurgitation is trivial. Aorta: The aortic root and ascending aorta are structurally normal, with no evidence of dilitation. Venous: The inferior  vena cava is normal in size with greater than 50% respiratory variability, suggesting right atrial pressure of 3 mmHg. IAS/Shunts: No atrial level shunt detected by color flow Doppler. Additional Comments: 3D was performed not requiring image post processing on an independent workstation and was indeterminate.  LEFT VENTRICLE PLAX 2D LVIDd:         3.90 cm   Diastology LVIDs:         2.80 cm   LV e' medial:    9.01 cm/s LV PW:         1.00 cm   LV E/e' medial:  6.8 LV IVS:        1.10 cm   LV e' lateral:   11.70 cm/s LVOT diam:     2.00 cm   LV E/e' lateral: 5.2 LV SV:         55 LV SV Index:   31 LVOT Area:     3.14 cm  RIGHT VENTRICLE RV Basal diam:  3.40 cm LEFT ATRIUM           Index        RIGHT ATRIUM           Index LA diam:      3.30 cm 1.86 cm/m   RA Area:     11.30 cm LA Vol (A4C): 31.8 ml 17.91 ml/m  RA Volume:   30.80 ml  17.35 ml/m  AORTIC VALVE                 PULMONIC VALVE AV Area (Vmax): 3.46 cm     PV Vmax:          0.87 m/s AV Vmax:        78.80 cm/s   PV Peak grad:     3.0 mmHg AV Peak Grad:   2.5 mmHg     PR End Diast Vel: 2.89 msec LVOT Vmax:      86.90 cm/s   RVOT Peak grad:   2 mmHg LVOT Vmean:     53.800 cm/s LVOT VTI:       0.176 m  AORTA Ao Root diam: 3.30 cm Ao Asc diam:  3.40 cm MITRAL VALVE               TRICUSPID VALVE MV Area (PHT): 3.89 cm    TR Peak grad:   41.2 mmHg  MV Decel Time: 195 msec    TR Vmax:        321.00 cm/s MV E velocity: 60.90 cm/s MV A velocity: 85.60 cm/s  SHUNTS MV E/A ratio:  0.71        Systemic VTI:  0.18 m                            Systemic Diam: 2.00 cm Debbe Odea MD Electronically signed by Debbe Odea MD Signature Date/Time: 10/08/2023/2:10:13 PM    Final         Scheduled Meds:  docusate sodium  100 mg Oral BID   enoxaparin (LOVENOX) injection  30 mg Subcutaneous Q24H   ferrous sulfate  325 mg Oral Q breakfast   gabapentin  200 mg Oral BID   insulin aspart  0-15 Units Subcutaneous TID WC   insulin aspart  0-5 Units  Subcutaneous QHS   pantoprazole  40 mg Oral Daily   Continuous Infusions:     LOS: 3 days       Charise Killian, MD [facility] Pager [phone]  If 7PM-7AM, please contact night-coverage 10/10/2023, 8:58 AM

## 2023-10-11 DIAGNOSIS — S72002A Fracture of unspecified part of neck of left femur, initial encounter for closed fracture: Secondary | ICD-10-CM | POA: Diagnosis not present

## 2023-10-11 LAB — BASIC METABOLIC PANEL
Anion gap: 9 (ref 5–15)
BUN: 18 mg/dL (ref 8–23)
CO2: 22 mmol/L (ref 22–32)
Calcium: 8.2 mg/dL — ABNORMAL LOW (ref 8.9–10.3)
Chloride: 103 mmol/L (ref 98–111)
Creatinine, Ser: 0.99 mg/dL (ref 0.61–1.24)
GFR, Estimated: >60 mL/min (ref >60)
Glucose, Bld: 195 mg/dL — ABNORMAL HIGH (ref 70–99)
Potassium: 3.6 mmol/L (ref 3.5–5.1)
Sodium: 134 mmol/L — ABNORMAL LOW (ref 135–145)

## 2023-10-11 LAB — CBC
HCT: 27.8 % — ABNORMAL LOW (ref 39.0–52.0)
Hemoglobin: 9.3 g/dL — ABNORMAL LOW (ref 13.0–17.0)
MCH: 30 pg (ref 26.0–34.0)
MCHC: 33.5 g/dL (ref 30.0–36.0)
MCV: 89.7 fL (ref 80.0–100.0)
Platelets: 206 10*3/uL (ref 150–400)
RBC: 3.1 MIL/uL — ABNORMAL LOW (ref 4.22–5.81)
RDW: 13.6 % (ref 11.5–15.5)
WBC: 8.3 10*3/uL (ref 4.0–10.5)
nRBC: 0 % (ref 0.0–0.2)

## 2023-10-11 LAB — GLUCOSE, CAPILLARY
Glucose-Capillary: 178 mg/dL — ABNORMAL HIGH (ref 70–99)
Glucose-Capillary: 201 mg/dL — ABNORMAL HIGH (ref 70–99)
Glucose-Capillary: 194 mg/dL — ABNORMAL HIGH (ref 70–99)
Glucose-Capillary: 155 mg/dL — ABNORMAL HIGH (ref 70–99)

## 2023-10-11 NOTE — Plan of Care (Signed)
  Problem: Coping: Goal: Ability to adjust to condition or change in health will improve Outcome: Progressing   Problem: Metabolic: Goal: Ability to maintain appropriate glucose levels will improve Outcome: Progressing   Problem: Clinical Measurements: Goal: Diagnostic test results will improve Outcome: Progressing   Problem: Nutrition: Goal: Adequate nutrition will be maintained Outcome: Progressing   Problem: Coping: Goal: Level of anxiety will decrease Outcome: Progressing   Problem: Pain Managment: Goal: General experience of comfort will improve and/or be controlled Outcome: Progressing   Problem: Safety: Goal: Ability to remain free from injury will improve Outcome: Progressing

## 2023-10-11 NOTE — Plan of Care (Signed)
  Problem: Fluid Volume: Goal: Ability to maintain a balanced intake and output will improve Outcome: Progressing   Problem: Nutritional: Goal: Maintenance of adequate nutrition will improve Outcome: Progressing   Problem: Skin Integrity: Goal: Risk for impaired skin integrity will decrease Outcome: Progressing

## 2023-10-11 NOTE — Progress Notes (Signed)
 Subjective: 3 Days Post-Op Procedure(s) (LRB): HEMIARTHROPLASTY (BIPOLAR) HIP, POSTERIOR APPROACH FOR FRACTURE (Left) Patient is alert sitting up in bed and ready lunch.  He has minimal confusion today.  Making slow progress with PT due to grahams confusion.  Dressing is dry.  Hip is stable.  Patient reports pain as mild.  Objective:   VITALS:   Vitals:   10/11/23 0506 10/11/23 0717  BP: (!) 116/58 132/68  Pulse: 77 84  Resp: 17 19  Temp: 98 F (36.7 C) 98 F (36.7 C)  SpO2: 98% 99%    Neurologically intact Neurovascular intact Incision: dressing C/D/I  LABS Recent Labs    10/09/23 0144 10/10/23 0609 10/11/23 0523  HGB 9.4* 9.3* 9.3*  HCT 28.6* 27.5* 27.8*  WBC 11.4* 9.9 8.3  PLT 194 186 206    Recent Labs    10/09/23 0144 10/10/23 0609 10/11/23 0523  NA 134* 134* 134*  K 3.6 3.9 3.6  BUN 19 19 18   CREATININE 1.02 1.16 0.99  GLUCOSE 108* 210* 195*    No results for input(s): "LABPT", "INR" in the last 72 hours.   Assessment/Plan: 3 Days Post-Op Procedure(s) (LRB): HEMIARTHROPLASTY (BIPOLAR) HIP, POSTERIOR APPROACH FOR FRACTURE (Left)   Advance diet Up with therapy Discharge to SNF when available. Weightbearing as tolerated with walker ASA 81 mg twice daily for 8 weeks following discharge for DVT prophylaxis Return to the office 2 to 3 weeks for x-rays and staple removal

## 2023-10-11 NOTE — Progress Notes (Signed)
 PROGRESS NOTE   HPI was taken from Dr. Chipper Herb:  Troy Preston is a 89 y.o. male with medical history significant of IIDM, dementia, questionable Parkinson's disease, GERD, sent from nursing home for evaluation of hip pain after fall.   Patient has dementia and does not remember much what had happened.  Most history provided by son over the phone.  Son/POA reported that patient has chronic ambulation impairment uses roller walker to ambulate despite he has been having increasing bilateral lower extremity weakness and shakiness, his PCP has been concerned about new onset of Parkinson's and the patient has had multiple fall before today's episode.  Patient has been using testosterone cream rubbing daily on both legs with minimal improvement of bilateral lower extremity weakness.  At baseline patient appears to have fair exercise tolerance, can usually walk 10 minutes without chest pain or shortness of breath.  No history of CAD, no complaining of chest pain or lightheadedness before today.  Appears that the patient fell yesterday in the facility and woke up this morning complaining about severe pain of the left hip and unable to put weight on. ED Course: Afebrile, not tachycardia blood pressure elevated 150/76.  CT head and neck negative for fracture or dislocation.  X-ray of the hip showed dislocated femoral neck fracture on the left side.  Blood work showed glucose 314, hemoglobin 12, creatinine 1.2, BUN 21.   Troy Preston  XBJ:[id number] DOB: 1933/07/30 DOA: 10/07/2023 PCP: Barbette Reichmann, MD  Assessment & Plan:   Principal Problem:   Hip fracture [facility]) Active Problems:   Femoral neck fracture (HCC)  Assessment and Plan: Left femoral neck fracture: s/p left hip hemiarthroplasty as per ortho surg. Norco, morphine prn for pain. PT/OT recs SNF. Ortho surg following and recs apprec     DM2: HbA1c 6.6, well controlled. Continue on SSI w/ accuchecks  Likely ACD: likely secondary to DM2.   Will likely component of acute blood loss anemia secondary to above recent surg. No need for a transfusion currently   Dementia: continue w/ supportive care      DVT prophylaxis: lovenox Code Status: DNR Family Communication:  Disposition Plan: likely d/c to SNF  Level of care: Med-Surg  Status is: Inpatient Remains inpatient appropriate because: medically stable. Waiting on SNF placement still, CM is working on this     Consultants:  Ortho surg   Procedures:   Antimicrobials:    Subjective: Pt c/o hip pain   Objective: Vitals:   10/10/23 1743 10/10/23 2101 10/11/23 0506 10/11/23 0717  BP: (!) 131/56 116/67 (!) 116/58 132/68  Pulse: 69 79 77 84  Resp: 16 17 17 19   Temp: 99.2 F (37.3 C) 98.3 F (36.8 C) 98 F (36.7 C) 98 F (36.7 C)  TempSrc:    Oral  SpO2: 99% 100% 98% 99%  Weight:      Height:        Intake/Output Summary (Last 24 hours) at 10/11/2023 0859 Last data filed at 10/11/2023 0500 Gross per 24 hour  Intake 840 ml  Output 2000 ml  Net -1160 ml   Filed Weights   10/07/23 1357  Weight: 63.5 kg    Examination:  General exam: Appears calm but uncomfortable  Respiratory system: clear breath sounds b/l  Cardiovascular system: S1/S2+ Gastrointestinal system: abd is soft, NT, ND & normal bowel sounds  Central nervous system: Alert & awake. Moves all extremities  Psychiatry: Judgement and insight appears poor. Flat mood and affect  Data Reviewed: I have personally reviewed following labs and imaging studies  CBC: Recent Labs  Lab 10/07/23 1359 10/08/23 1635 10/09/23 0144 10/10/23 0609 10/11/23 0523  WBC 10.8* 12.9* 11.4* 9.9 8.3  HGB 11.2* 10.4* 9.4* 9.3* 9.3*  HCT 35.2* 31.5* 28.6* 27.5* 27.8*  MCV 95.1 92.9 90.8 89.9 89.7  PLT 248 204 194 186 206   Basic Metabolic Panel: Recent Labs  Lab 10/07/23 1359 10/08/23 1635 10/09/23 0144 10/10/23 0609 10/11/23 0523  NA 136  --  134* 134* 134*  K 3.7  --  3.6 3.9 3.6  CL 104   --  106 105 103  CO2 21*  --  23 20* 22  GLUCOSE 314*  --  108* 210* 195*  BUN 21  --  19 19 18   CREATININE 1.20 1.00 1.02 1.16 0.99  CALCIUM 8.7*  --  7.8* 8.1* 8.2*   GFR: Estimated Creatinine Clearance: 47.2 mL/min (by C-G formula based on SCr of 0.99 mg/dL). Liver Function Tests: No results for input(s): "AST", "ALT", "ALKPHOS", "BILITOT", "PROT", "ALBUMIN" in the last 168 hours. No results for input(s): "LIPASE", "AMYLASE" in the last 168 hours. No results for input(s): "AMMONIA" in the last 168 hours. Coagulation Profile: Recent Labs  Lab 10/07/23 1707  INR 1.2   Cardiac Enzymes: No results for input(s): "CKTOTAL", "CKMB", "CKMBINDEX", "TROPONINI" in the last 168 hours. BNP (last 3 results) No results for input(s): "PROBNP" in the last 8760 hours. HbA1C: No results for input(s): "HGBA1C" in the last 72 hours.  CBG: Recent Labs  Lab 10/10/23 1202 10/10/23 1709 10/10/23 2131 10/10/23 2154 10/11/23 0844  GLUCAP 192* 188* 170* 178* 178*   Lipid Profile: No results for input(s): "CHOL", "HDL", "LDLCALC", "TRIG", "CHOLHDL", "LDLDIRECT" in the last 72 hours. Thyroid Function Tests: No results for input(s): "TSH", "T4TOTAL", "FREET4", "T3FREE", "THYROIDAB" in the last 72 hours. Anemia Panel: No results for input(s): "VITAMINB12", "FOLATE", "FERRITIN", "TIBC", "IRON", "RETICCTPCT" in the last 72 hours. Sepsis Labs: No results for input(s): "PROCALCITON", "LATICACIDVEN" in the last 168 hours.  No results found for this or any previous visit (from the past 240 hours).       Radiology Studies: No results found.       Scheduled Meds:  docusate sodium  100 mg Oral BID   enoxaparin (LOVENOX) injection  30 mg Subcutaneous Q24H   ferrous sulfate  325 mg Oral Q breakfast   gabapentin  200 mg Oral BID   insulin aspart  0-15 Units Subcutaneous TID WC   insulin aspart  0-5 Units Subcutaneous QHS   pantoprazole  40 mg Oral Daily   Continuous Infusions:     LOS:  4 days       Charise Killian, MD [facility] Pager [phone]  If 7PM-7AM, please contact night-coverage 10/11/2023, 8:59 AM

## 2023-10-11 NOTE — TOC Progression Note (Addendum)
 Transition of Care [facility]) - Progression Note    Patient Details  Name: Troy Preston MRN: [id number] Date of Birth: 07-22-1933  Transition of Care [facility]) CM/SW Contact  Marlowe Sax, RN Phone Number: 10/11/2023, 3:19 PM  Clinical Narrative:    Spoke with the son and POA [name] and confirmed physical address, [address], [place] [address] he stated that they have planned to go to [place] [facility] and have been in contact with [facility] [phone], they plan to then transition into LTC from there Spoke with Whitney at [facility] she stated that she will review and see if they can offer a bed for STR and that they have a waiting list for LTC She stated that she has told the family this She will review and get back in touch with me to see if they can offer a bed and will then need Ins approval          Expected Discharge Plan and Services                                               Social Determinants of Health (SDOH) Interventions SDOH Screenings   Food Insecurity: No Food Insecurity (10/07/2023)  Housing: Low Risk  (10/07/2023)  Transportation Needs: No Transportation Needs (10/07/2023)  Utilities: Not At Risk (10/07/2023)  Financial Resource Strain: Low Risk  (06/03/2017)   Received from [facility], [facility]  Physical Activity: Unknown (06/03/2017)   Received from [facility], [facility]  Social Connections: Socially Isolated (10/07/2023)  Stress: No Stress Concern Present (06/03/2017)   Received from [facility], [facility]  Tobacco Use: High Risk (10/07/2023)    Readmission Risk Interventions     No data to display

## 2023-10-11 NOTE — NC FL2 (Signed)
 [place] MEDICAID FL2 LEVEL OF CARE FORM     IDENTIFICATION  Patient Name: Troy Preston Birthdate: 10/05/1933 Sex: male Admission Date (Current Location): 10/07/2023  [facility] and [place] Number:  Chiropodist and Address:  [facility], [address] [place], [place] [address]      Provider Number: [id number]  Attending Physician Name and Address:  Charise Killian, MD  Relative Name and Phone Number:  Troy Paula [facility]) Joesphine Bare  Son  Emergency Contact  [phone]    Current Level of Care: Hospital Recommended Level of Care: Skilled Nursing Facility Prior Approval Number:    Date Approved/Denied:   PASRR Number: [id number]  Discharge Plan: SNF    Current Diagnoses: Patient Active Problem List   Diagnosis Date Noted   Femoral neck fracture (HCC) 10/07/2023   Hip fracture (HCC) 10/07/2023   Hypogonadism in male 12/28/2014   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   Gallstone 12/28/2014   Blood glucose elevated 09/20/2014   External hemorrhoid 09/20/2014   Constipation 05/15/2014   Esophagitis 05/15/2014   Borderline diabetes 05/15/2014   Buzzing in ear 05/15/2014    Orientation RESPIRATION BLADDER Height & Weight     Self  Normal Continent Weight: 63.5 kg Height:  5\' 9"  (175.3 cm)  BEHAVIORAL SYMPTOMS/MOOD NEUROLOGICAL BOWEL NUTRITION STATUS      Continent Diet (Carb Modified)  AMBULATORY STATUS COMMUNICATION OF NEEDS Skin   Extensive Assist   Normal, Surgical wounds                       Personal Care Assistance Level of Assistance  Bathing, Feeding, Dressing Bathing Assistance: Maximum assistance Feeding assistance: Limited assistance Dressing Assistance: Maximum assistance     Functional Limitations Info  Sight, Hearing, Speech Sight Info: Adequate Hearing Info: Adequate Speech Info: Adequate    SPECIAL CARE FACTORS FREQUENCY  PT (By licensed PT), OT (By licensed OT)     PT  Frequency: 5 times per week OT Frequency: 5 times per week            Contractures Contractures Info: Not present    Additional Factors Info  Code Status, Allergies Code Status Info: DNR Allergies Info: NKDA           Current Medications (10/11/2023):  This is the current hospital active medication list Current Facility-Administered Medications  Medication Dose Route Frequency Provider Last Rate Last Admin   acetaminophen (TYLENOL) tablet 650 mg  650 mg Oral Q6H PRN Deeann Saint, MD       alum & mag hydroxide-simeth (MAALOX/MYLANTA) 200-200-20 MG/5ML suspension 30 mL  30 mL Oral Q4H PRN Deeann Saint, MD       bisacodyl (DULCOLAX) EC tablet 5 mg  5 mg Oral Daily PRN Deeann Saint, MD       bisacodyl (DULCOLAX) suppository 10 mg  10 mg Rectal Daily PRN Deeann Saint, MD       docusate sodium (COLACE) capsule 100 mg  100 mg Oral BID Deeann Saint, MD   100 mg at 10/11/23 1011   enoxaparin (LOVENOX) injection 30 mg  30 mg Subcutaneous Q24H Deeann Saint, MD   30 mg at 10/11/23 0914   fentaNYL (SUBLIMAZE) injection 25 mcg  25 mcg Intravenous Once PRN Deeann Saint, MD       ferrous sulfate tablet 325 mg  325 mg Oral Q breakfast Deeann Saint, MD   325 mg at 10/11/23 0905   gabapentin (NEURONTIN) capsule 200 mg  200 mg Oral BID Deeann Saint, MD   200 mg at 10/11/23 1011   HYDROcodone-acetaminophen (NORCO/VICODIN) 5-325 MG per tablet 1-2 tablet  1-2 tablet Oral Q4H PRN Deeann Saint, MD       insulin aspart (novoLOG) injection 0-15 Units  0-15 Units Subcutaneous TID [facility] Deeann Saint, MD   5 Units at 10/11/23 1206   insulin aspart (novoLOG) injection 0-5 Units  0-5 Units Subcutaneous QHS Deeann Saint, MD   2 Units at 10/08/23 2133   menthol-cetylpyridinium (CEPACOL) lozenge 3 mg  1 lozenge Oral PRN Deeann Saint, MD       Or   phenol (CHLORASEPTIC) mouth spray 1 spray  1 spray Mouth/Throat PRN Deeann Saint, MD       methocarbamol (ROBAXIN) tablet 500 mg  500 mg Oral Q6H  PRN Deeann Saint, MD       Or   methocarbamol (ROBAXIN) injection 500 mg  500 mg Intravenous Q6H PRN Deeann Saint, MD       metoCLOPramide (REGLAN) tablet 5-10 mg  5-10 mg Oral Q8H PRN Deeann Saint, MD       Or   metoCLOPramide (REGLAN) injection 5-10 mg  5-10 mg Intravenous Q8H PRN Deeann Saint, MD       morphine (PF) 2 MG/ML injection 0.5-1 mg  0.5-1 mg Intravenous Q2H PRN Deeann Saint, MD       ondansetron [facility]) tablet 4 mg  4 mg Oral Q6H PRN Deeann Saint, MD       Or   ondansetron [facility]) injection 4 mg  4 mg Intravenous Q6H PRN Deeann Saint, MD       Oral care mouth rinse  15 mL Mouth Rinse PRN Charise Killian, MD       pantoprazole (PROTONIX) EC tablet 40 mg  40 mg Oral Daily Deeann Saint, MD   40 mg at 10/11/23 1007   sodium phosphate (FLEET) enema 1 enema  1 enema Rectal Once PRN Deeann Saint, MD       zolpidem Remus Loffler) tablet 5 mg  5 mg Oral QHS PRN Deeann Saint, MD         Discharge Medications: Please see discharge summary for a list of discharge medications.  Relevant Imaging Results:  Relevant Lab Results:   Additional Information [id number]  Marlowe Sax, RN

## 2023-10-11 NOTE — Progress Notes (Signed)
 Physical Therapy Treatment Patient Details Name: Troy Preston MRN: [id number] DOB: 03/25/1934 Today's Date: 10/11/2023   History of Present Illness Patient is an 89 year old male sent from nursing home s/p fall with hip pain. PMH includes type II MD, dementia, possible Parkinson's disease, GERD, wears hearing aides. Patient underwent L hip hemiarthroplasty on 10/08/23.    PT Comments  Pt was long sitting in bed, awake, but disoriented. He is pleasantly confused but is cooperative. Aware he hurt his hip but unaware he already had fixation. Pt requires extensive max assist to achieve EOB sitting and then later to return to supine. Hip abduction pillow on pre/post session. Pt was able to stand 4 x with bed height elevated + max assist + max vcs. Pt has severe posterior push + Bilateral knees flexed upon standing.max assist + vcs to erect posture. Pt is unable to progress to lifting feet to take steps. He returned to bed at conclusion of transfer training. RN student at bedside to assist with bathing pt post session. Acute PT will continue to follow and progress per current POC. DC recs remain most appropriate disposition to maximize his independence and safety while decreasing caregiver burden.    If plan is discharge home, recommend the following: A lot of help with walking and/or transfers;A lot of help with bathing/dressing/bathroom;Assistance with cooking/housework;Assistance with feeding;Direct supervision/assist for medications management;Direct supervision/assist for financial management;Assist for transportation;Help with stairs or ramp for entrance;Supervision due to cognitive status     Equipment Recommendations  Other (comment) (Defer to next level of care)       Precautions / Restrictions Precautions Precautions: Fall;Posterior Hip Precaution Booklet Issued: Yes (comment) Recall of Precautions/Restrictions: Impaired Restrictions Weight Bearing Restrictions Per Provider Order:  Yes LLE Weight Bearing Per Provider Order: Weight bearing as tolerated Other Position/Activity Restrictions: no crossing limb, no >90     Mobility  Bed Mobility Overal bed mobility: Needs Assistance Bed Mobility: Supine to Sit, Sit to Supine  Supine to sit: Max assist, HOB elevated, Used rails, Total assist Sit to supine: Max assist, Total assist     Transfers Overall transfer level: Needs assistance Equipment used: Rolling walker (2 wheels) Transfers: Sit to/from Stand Sit to Stand: Max assist, From elevated surface  General transfer comment: Pt requires max assist to stand from elevated bed height 4 x. pt has severe posterior lean + knees flexed in standing. pt tolerated standing ~ 30-sec to 1 min each trial but is unable to actually progress to taking steps. Poor standing posture throughout. vcs for extending knees and erecting posture    Ambulation/Gait  General Gait Details: unable   Balance Overall balance assessment: Needs assistance Sitting-balance support: Feet supported Sitting balance-Leahy Scale: Fair     Standing balance support: Bilateral upper extremity supported, During functional activity, Reliant on assistive device for balance Standing balance-Leahy Scale: Poor    Communication Communication Communication: No apparent difficulties  Cognition Arousal: Alert Behavior During Therapy: WFL for tasks assessed/performed   PT - Cognitive impairments: History of cognitive impairments   PT - Cognition Comments: Pt is alert however disoriented. Did not know he had surgery already but pt remains extremely pleasant and cooperative Following commands: Intact      Cueing Cueing Techniques: Verbal cues, Gestural cues, Tactile cues, Visual cues         Pertinent Vitals/Pain Pain Assessment Pain Assessment: 0-10 Pain Score: 6      PT Goals (current goals can now be found in the care plan section)  Acute Rehab PT Goals Patient Stated Goal: walk again Progress  towards PT goals: Progressing toward goals    Frequency    7X/week       Co-evaluation     PT goals addressed during session: Mobility/safety with mobility;Balance;Proper use of DME;Strengthening/ROM        AM-PAC PT "6 Clicks" Mobility   Outcome Measure  Help needed turning from your back to your side while in a flat bed without using bedrails?: A Lot Help needed moving from lying on your back to sitting on the side of a flat bed without using bedrails?: A Lot Help needed moving to and from a bed to a chair (including a wheelchair)?: Total Help needed standing up from a chair using your arms (e.g., wheelchair or bedside chair)?: Total Help needed to walk in hospital room?: Total Help needed climbing 3-5 steps with a railing? : Total 6 Click Score: 8    End of Session   Activity Tolerance: Patient tolerated treatment well;Patient limited by pain Patient left: in bed;with call bell/phone within reach;Other (comment) Interior and spatial designer giving pt a bath at conclusion of PT session) Nurse Communication: Mobility status PT Visit Diagnosis: Unsteadiness on feet (R26.81);Other abnormalities of gait and mobility (R26.89);Muscle weakness (generalized) (M62.81);History of falling (Z91.81);Difficulty in walking, not elsewhere classified (R26.2);Pain Pain - Right/Left: Left Pain - part of body: Hip     Time: 2956-2130 PT Time Calculation (min) (ACUTE ONLY): 20 min  Charges:    $Therapeutic Activity: 8-22 mins PT General Charges $$ ACUTE PT VISIT: 1 Visit                    Jetta Lout PTA 10/11/23, 10:21 AM

## 2023-10-12 DIAGNOSIS — S72002S Fracture of unspecified part of neck of left femur, sequela: Secondary | ICD-10-CM

## 2023-10-12 DIAGNOSIS — H9190 Unspecified hearing loss, unspecified ear: Secondary | ICD-10-CM

## 2023-10-12 DIAGNOSIS — D62 Acute posthemorrhagic anemia: Secondary | ICD-10-CM

## 2023-10-12 DIAGNOSIS — E1165 Type 2 diabetes mellitus with hyperglycemia: Secondary | ICD-10-CM | POA: Diagnosis not present

## 2023-10-12 DIAGNOSIS — N401 Enlarged prostate with lower urinary tract symptoms: Secondary | ICD-10-CM

## 2023-10-12 DIAGNOSIS — E871 Hypo-osmolality and hyponatremia: Secondary | ICD-10-CM

## 2023-10-12 DIAGNOSIS — Z8719 Personal history of other diseases of the digestive system: Secondary | ICD-10-CM

## 2023-10-12 DIAGNOSIS — N138 Other obstructive and reflux uropathy: Secondary | ICD-10-CM

## 2023-10-12 LAB — GLUCOSE, CAPILLARY
Glucose-Capillary: 162 mg/dL — ABNORMAL HIGH (ref 70–99)
Glucose-Capillary: 219 mg/dL — ABNORMAL HIGH (ref 70–99)
Glucose-Capillary: 225 mg/dL — ABNORMAL HIGH (ref 70–99)
Glucose-Capillary: 124 mg/dL — ABNORMAL HIGH (ref 70–99)

## 2023-10-12 LAB — CBC
HCT: 28.5 % — ABNORMAL LOW (ref 39.0–52.0)
Hemoglobin: 9.4 g/dL — ABNORMAL LOW (ref 13.0–17.0)
MCH: 29.7 pg (ref 26.0–34.0)
MCHC: 33 g/dL (ref 30.0–36.0)
MCV: 90.2 fL (ref 80.0–100.0)
Platelets: 228 10*3/uL (ref 150–400)
RBC: 3.16 MIL/uL — ABNORMAL LOW (ref 4.22–5.81)
RDW: 13.6 % (ref 11.5–15.5)
WBC: 7.2 10*3/uL (ref 4.0–10.5)
nRBC: 0 % (ref 0.0–0.2)

## 2023-10-12 NOTE — Assessment & Plan Note (Signed)
 Marland Kitchen

## 2023-10-12 NOTE — Assessment & Plan Note (Signed)
 Left hip hemiarthroplasty done by Dr. Hyacinth Meeker on 3/22.  Pain control.  Will need rehab.

## 2023-10-12 NOTE — Assessment & Plan Note (Signed)
Patient on Flomax

## 2023-10-12 NOTE — Assessment & Plan Note (Signed)
 Sodium normalized

## 2023-10-12 NOTE — TOC Progression Note (Addendum)
 Transition of Care [facility]) - Progression Note    Patient Details  Name: Troy Preston MRN: [id number] Date of Birth: Nov 27, 1933  Transition of [facility]) CM/SW Contact  Marlowe Sax, RN Phone Number: 10/12/2023, 2:50 PM  Clinical Narrative:    Sherron Monday with [name] the patient's Son, he stated that family has been doing the paperwork to have Long term care they have a waiting list  He asked to have information sent to [facility], I sent thru the Hub and asked him to also call them since they are looking for long term care now, He is agreeable and will go to [facility]      Expected Discharge Plan and Services                                               Social Determinants of Health (SDOH) Interventions SDOH Screenings   Food Insecurity: No Food Insecurity (10/07/2023)  Housing: Low Risk  (10/07/2023)  Transportation Needs: No Transportation Needs (10/07/2023)  Utilities: Not At Risk (10/07/2023)  Financial Resource Strain: Low Risk  (06/03/2017)   [facility], [facility]  Physical Activity: Unknown (06/03/2017)   Received from [facility], [facility]  Social Connections: Socially Isolated (10/07/2023)  Stress: No Stress Concern Present (06/03/2017)   Received from [facility], [facility]  Tobacco Use: High Risk (10/07/2023)    Readmission Risk Interventions     No data to display

## 2023-10-12 NOTE — Assessment & Plan Note (Signed)
 Hemoglobin A1c actually very good at 6.6.  Patient on sliding scale insulin.  At home takes metformin.

## 2023-10-12 NOTE — Progress Notes (Signed)
 Progress Note   Patient: Troy Preston ZOX:[id number] DOB: [birth year] DOA: 10/07/2023     5 DOS: the patient was seen and examined on 10/12/2023   Brief hospital course: 89 y.o. male with medical history significant of IIDM, dementia, questionable Parkinson's disease, GERD, sent from nursing home for evaluation of hip pain after fall.   Patient has dementia and does not remember much what had happened.  Most history provided by son over the phone.  Son/POA reported that patient has chronic ambulation impairment uses roller walker to ambulate despite he has been having increasing bilateral lower extremity weakness and shakiness, his PCP has been concerned about new onset of Parkinson's and the patient has had multiple fall before today's episode.  Patient has been using testosterone cream rubbing daily on both legs with minimal improvement of bilateral lower extremity weakness.  At baseline patient appears to have fair exercise tolerance, can usually walk 10 minutes without chest pain or shortness of breath.  No history of CAD, no complaining of chest pain or lightheadedness before today.  Appears that the patient fell yesterday in the facility and woke up this morning complaining about severe pain of the left hip and unable to put weight on. ED Course: Afebrile, not tachycardia blood pressure elevated 150/76.  CT head and neck negative for fracture or dislocation.  X-ray of the hip showed dislocated femoral neck fracture on the left side.  Blood work showed glucose 314, hemoglobin 12, creatinine 1.2, BUN 21.  3/22.  Left hip hemiarthroplasty with Stryker Accolade prosthesis done by Dr. Hyacinth Meeker. 3/26.  Awaiting to hear back on facilities and then will need insurance authorization.  Hemoglobin 9.4.   Assessment and Plan: * Closed hip fracture requiring operative repair, left, sequela Left hip hemiarthroplasty done by Dr. Hyacinth Meeker on 3/22.  Pain control.  Will need rehab.  Acute blood loss  anemia Hemoglobin stabilized at 9.4.  Uncontrolled type 2 diabetes mellitus with hyperglycemia, without long-term current use of insulin (HCC) Hemoglobin A1c actually very good at 6.6.  Patient on sliding scale insulin.  At home takes metformin.  BPH with obstruction/lower urinary tract symptoms Patient on Flomax  History of esophagitis On PPI  Hard of hearing .  Hyponatremia Sodium 1 point less than the normal range.        Subjective: Patient feels okay.  Was sitting in the chair.  Wanted to get back into the bed.  Admitted with hip fracture.  Physical Exam: Vitals:   10/11/23 1615 10/11/23 2033 10/12/23 0837 10/12/23 1458  BP: 121/71 111/65 130/74 (!) 119/51  Pulse: 72 63 71 76  Resp: 17 18 17 16   Temp: 98 F (36.7 C) 98.4 F (36.9 C) 98 F (36.7 C) (!) 97.4 F (36.3 C)  TempSrc:  Oral  Oral  SpO2: 100% 99% 100% 99%  Weight:      Height:       Physical Exam HENT:     Head: Normocephalic.     Mouth/Throat:     Pharynx: No oropharyngeal exudate.  Eyes:     General: Lids are normal.     Conjunctiva/sclera: Conjunctivae normal.  Cardiovascular:     Rate and Rhythm: Normal rate and regular rhythm.     Heart sounds: Normal heart sounds, S1 normal and S2 normal.  Pulmonary:     Breath sounds: No decreased breath sounds, wheezing, rhonchi or rales.  Abdominal:     Palpations: Abdomen is soft.     Tenderness: There is no abdominal  tenderness.  Musculoskeletal:     Right lower leg: No swelling.     Left lower leg: No swelling.  Skin:    General: Skin is warm.     Findings: No rash.  Neurological:     Mental Status: He is alert.     Comments: Able to lift legs up off the chair.     Data Reviewed: Sodium 134, creatinine 0.99, hemoglobin 9.4, platelet count 228, white blood count 7.2  Family Communication: Spoke with son on the phone  Disposition: Status is: Inpatient Remains inpatient appropriate because: Will need a rehab bed and insurance  authorization  Planned Discharge Destination: Rehab    Time spent: 28 minutes  Author: Alford Highland, MD 10/12/2023 4:04 PM  For on call review [email or web address].

## 2023-10-12 NOTE — Assessment & Plan Note (Signed)
 Hemoglobin stabilized at 9.4.

## 2023-10-12 NOTE — Progress Notes (Signed)
 Occupational Therapy Treatment Patient Details Name: Troy Preston MRN: [id number] DOB: 04-24-1934 Today's Date: 10/12/2023   History of present illness Patient is an 89 year old male sent from nursing home s/p fall with hip pain. PMH includes type II MD, dementia, possible Parkinson's disease, GERD, wears hearing aides. Patient underwent L hip hemiarthroplasty on 10/08/23.   OT comments  Troy Preston was seen for OT treatment on this date. Upon arrival to room pt reclined in chair, agreeable to tx. Pt requires MAX A x2 sit<>stand, heavy posterior lean with difficulty sequencing transfers. MAX-TOTAL A x2 chair>bed squat pivot t/f. MAX A for LB access in sitting. Pt making progress toward goals, will continue to follow POC. Discharge recommendation remains appropriate.        If plan is discharge home, recommend the following:  Two people to help with walking and/or transfers;Two people to help with bathing/dressing/bathroom;Help with stairs or ramp for entrance;Supervision due to cognitive status   Equipment Recommendations  Other (comment) (defer)    Recommendations for Other Services      Precautions / Restrictions Precautions Precautions: Fall;Posterior Hip Restrictions Weight Bearing Restrictions Per Provider Order: Yes LLE Weight Bearing Per Provider Order: Weight bearing as tolerated       Mobility Bed Mobility Overal bed mobility: Needs Assistance Bed Mobility: Sit to Supine       Sit to supine: Max assist, Total assist, +2 for physical assistance        Transfers Overall transfer level: Needs assistance Equipment used: 2 person hand held assist Transfers: Sit to/from Stand, Bed to chair/wheelchair/BSC Sit to Stand: Max assist   Squat pivot transfers: Max assist, Total assist, +2 physical assistance             Balance Overall balance assessment: Needs assistance Sitting-balance support: Feet supported Sitting balance-Leahy Scale: Fair     Standing  balance support: Bilateral upper extremity supported, During functional activity, Reliant on assistive device for balance Standing balance-Leahy Scale: Zero                             ADL either performed or assessed with clinical judgement   ADL Overall ADL's : Needs assistance/impaired                                       General ADL Comments: MAX A x2 simulated BSC t/f. MAX A for LB access in sitting     Communication Communication Communication: No apparent difficulties   Cognition Arousal: Alert Behavior During Therapy: Anxious Cognition: History of cognitive impairments                               Following commands: Intact        Cueing   Cueing Techniques: Verbal cues             Pertinent Vitals/ Pain       Pain Assessment Pain Assessment: PAINAD Breathing: normal Negative Vocalization: none Facial Expression: sad, frightened, frown Body Language: relaxed Consolability: distracted or reassured by voice/touch PAINAD Score: 2 Pain Location: L hip Pain Descriptors / Indicators: Aching, Discomfort, Grimacing, Operative site guarding Pain Intervention(s): Limited activity within patient's tolerance, Repositioned   Frequency  Min 2X/week        Progress Toward Goals  OT Goals(current goals can  now be found in the care plan section)  Progress towards OT goals: Progressing toward goals  Acute Rehab OT Goals OT Goal Formulation: With patient Time For Goal Achievement: 10/24/23 Potential to Achieve Goals: Fair ADL Goals Pt Will Perform Grooming: sitting;with set-up;with supervision Pt Will Perform Lower Body Dressing: with mod assist;with caregiver independent in assisting;sit to/from stand Pt Will Transfer to Toilet: with mod assist;ambulating;bedside commode  Plan      Co-evaluation        PT goals addressed during session: Mobility/safety with mobility;Balance;Proper use of DME;Strengthening/ROM         AM-PAC OT "6 Clicks" Daily Activity     Outcome Measure   Help from another person eating meals?: None Help from another person taking care of personal grooming?: A Little Help from another person toileting, which includes using toliet, bedpan, or urinal?: A Lot Help from another person bathing (including washing, rinsing, drying)?: A Lot Help from another person to put on and taking off regular upper body clothing?: A Lot Help from another person to put on and taking off regular lower body clothing?: A Lot 6 Click Score: 15    End of Session Equipment Utilized During Treatment: Rolling walker (2 wheels)  OT Visit Diagnosis: Other abnormalities of gait and mobility (R26.89);Muscle weakness (generalized) (M62.81)   Activity Tolerance Patient tolerated treatment well   Patient Left in bed;with call bell/phone within reach;with bed alarm set   Nurse Communication Mobility status        Time: 1610-9604 OT Time Calculation (min): 12 min  Charges: OT General Charges $OT Visit: 1 Visit OT Treatments $Self Care/Home Management : 8-22 mins  Kathie Dike, M.S. OTR/L  10/12/23, 1:03 PM  ascom [phone]

## 2023-10-12 NOTE — Hospital Course (Signed)
 89 y.o. male with medical history significant of IIDM, dementia, questionable Parkinson's disease, GERD, sent from nursing home for evaluation of hip pain after fall.   Patient has dementia and does not remember much what had happened.  Most history provided by son over the phone.  Son/POA reported that patient has chronic ambulation impairment uses roller walker to ambulate despite he has been having increasing bilateral lower extremity weakness and shakiness, his PCP has been concerned about new onset of Parkinson's and the patient has had multiple fall before today's episode.  Patient has been using testosterone cream rubbing daily on both legs with minimal improvement of bilateral lower extremity weakness.  At baseline patient appears to have fair exercise tolerance, can usually walk 10 minutes without chest pain or shortness of breath.  No history of CAD, no complaining of chest pain or lightheadedness before today.  Appears that the patient fell yesterday in the facility and woke up this morning complaining about severe pain of the left hip and unable to put weight on. ED Course: Afebrile, not tachycardia blood pressure elevated 150/76.  CT head and neck negative for fracture or dislocation.  X-ray of the hip showed dislocated femoral neck fracture on the left side.  Blood work showed glucose 314, hemoglobin 12, creatinine 1.2, BUN 21.  3/22.  Left hip hemiarthroplasty with Stryker Accolade prosthesis done by Dr. Hyacinth Meeker. 3/26.  Awaiting to hear back on facilities and then will need insurance authorization.  Hemoglobin 9.4. 3/27.  Patient complaining of itching on his back.  No rash seen.  As needed Benadryl cream.  Notified that facility will be able to take him tomorrow. 3/28.  Patient stable for discharge to facility.  Hemoglobin 9.1.  Orthopedic surgery recommended aspirin 81 mg twice a day for 8 weeks for DVT prophylaxis upon discharge.  They can stop this medication after that.

## 2023-10-12 NOTE — Plan of Care (Signed)
  Problem: Metabolic: Goal: Ability to maintain appropriate glucose levels will improve Outcome: Progressing   Problem: Nutritional: Goal: Maintenance of adequate nutrition will improve Outcome: Progressing   Problem: Clinical Measurements: Goal: Diagnostic test results will improve Outcome: Progressing   Problem: Coping: Goal: Level of anxiety will decrease Outcome: Progressing   Problem: Activity: Goal: Ability to ambulate and perform ADLs will improve Outcome: Progressing   Problem: Pain Management: Goal: Pain level will decrease Outcome: Progressing

## 2023-10-12 NOTE — Progress Notes (Signed)
 Subjective: 4 Days Post-Op Procedure(s) (LRB): HEMIARTHROPLASTY (BIPOLAR) HIP, POSTERIOR APPROACH FOR FRACTURE (Left) Patient is doing well and stable.  He is somewhat confused.  His hip is stable.  Dressing is dry.  Plan is for him to go to rehab.  Patient reports pain as mild.  Objective:   VITALS:   Vitals:   10/11/23 2033 10/12/23 0837  BP: 111/65 130/74  Pulse: 63 71  Resp: 18 17  Temp: 98.4 F (36.9 C) 98 F (36.7 C)  SpO2: 99% 100%    Neurologically intact ABD soft Incision: dressing C/D/I  LABS Recent Labs    10/10/23 0609 10/11/23 0523 10/12/23 0416  HGB 9.3* 9.3* 9.4*  HCT 27.5* 27.8* 28.5*  WBC 9.9 8.3 7.2  PLT 186 206 228    Recent Labs    10/10/23 0609 10/11/23 0523  NA 134* 134*  K 3.9 3.6  BUN 19 18  CREATININE 1.16 0.99  GLUCOSE 210* 195*    No results for input(s): "LABPT", "INR" in the last 72 hours.   Assessment/Plan: 4 Days Post-Op Procedure(s) (LRB): HEMIARTHROPLASTY (BIPOLAR) HIP, POSTERIOR APPROACH FOR FRACTURE (Left)   Advance diet Up with therapy Discharge to SNF 81 mg ASA twice daily for DVT prophylaxis for 8 weeks. Weightbearing as tolerated left leg with walker Return to clinic 2 weeks for exam and x-ray

## 2023-10-12 NOTE — TOC Progression Note (Signed)
 Transition of Care [facility]) - Progression Note    Patient Details  Name: Troy Preston MRN: [id number] Date of Birth: 01/04/1934  [facility]) CM/SW Contact  Marlowe Sax, RN Phone Number: 10/12/2023, 10:17 AM  Clinical Narrative:     Lynne Logan at [facility] to see if they have reviewed the patient yet, She stated that she reviewed and does not feel that he is STR appropriate and needs LTC, she is speaking to her team to see if they have talked to the family yet about LTC and she will call me back       Expected Discharge Plan and Services                                               Social Determinants of Health (SDOH) Interventions SDOH Screenings   Food Insecurity: No Food Insecurity (10/07/2023)  Housing: Low Risk  (10/07/2023)  Transportation Needs: No Transportation Needs (10/07/2023)  Utilities: Not At Risk (10/07/2023)  Financial Resource Strain: Low Risk  (06/03/2017)   Received from [facility], [facility]  Physical Activity: Unknown (06/03/2017)   Received from [facility], [facility]  Social Connections: Socially Isolated (10/07/2023)  Stress: No Stress Concern Present (06/03/2017)   Received from [facility], [facility]  Tobacco Use: High Risk (10/07/2023)    Readmission Risk Interventions     No data to display

## 2023-10-12 NOTE — Assessment & Plan Note (Signed)
 On PPI

## 2023-10-12 NOTE — Progress Notes (Signed)
 Physical Therapy Treatment Patient Details Name: Troy Preston MRN: [id number] DOB: 10-Feb-1934 Today's Date: 10/12/2023   History of Present Illness Patient is an 89 year old male sent from nursing home s/p fall with hip pain. PMH includes type II MD, dementia, possible Parkinson's disease, GERD, wears hearing aides. Patient underwent L hip hemiarthroplasty on 10/08/23.    PT Comments  Pt was long sitting in bed upon arrival. He is alert and agreeable to session but still disoriented. Pleasantly confused throughout. He was agreeable to OOB activity. Pt is gets severely anxious during transfers. He stood with max assist from elevated bed height to RW 3x prior to stand pivot to recliner. Pt has severe posterior push and poor standing posture. Max-total of 1 to stand pivot to recliner without use of RW so author could block pt's knees to prevent buckling. Pt requires constant vcs for relaxation and breathing due to his severe fear of falling. Dc recs remain appropriate to maximize pt's independence while decreasing caregiver burden. Acute PT will continue to follow and progress per current POC.    If plan is discharge home, recommend the following: A lot of help with walking and/or transfers;A lot of help with bathing/dressing/bathroom;Assistance with cooking/housework;Assistance with feeding;Direct supervision/assist for medications management;Direct supervision/assist for financial management;Assist for transportation;Help with stairs or ramp for entrance;Supervision due to cognitive status     Equipment Recommendations  Other (comment) (Defer to next level of care)       Precautions / Restrictions Precautions Precautions: Fall;Posterior Hip Precaution Booklet Issued: Yes (comment) Recall of Precautions/Restrictions: Impaired Restrictions Weight Bearing Restrictions Per Provider Order: Yes LLE Weight Bearing Per Provider Order: Weight bearing as tolerated Other Position/Activity Restrictions:  no crossing limb, no >90     Mobility  Bed Mobility Overal bed mobility: Needs Assistance Bed Mobility: Supine to Sit, Sit to Supine  Supine to sit: Max assist, HOB elevated, Used rails  General bed mobility comments: Max assistance + vcs to achieve EOB sitting. Max assistance plus vcs to safely achieve EOB short sit with increased time.    Transfers Overall transfer level: Needs assistance Equipment used: Rolling walker (2 wheels) Transfers: Sit to/from Stand, Bed to chair/wheelchair/BSC Sit to Stand: Max assist, From elevated surface Stand pivot transfers: Max assist, From elevated surface  General transfer comment: pt continues to have severe anxiety and posterior push with standing. Due to pt's severe anxiety with standing, pt required contant relaxation cues and breathing to relax. poor standing tolerance and abilities. pt remains high fall risk. Did perform stand pivot to recliner form EOB with max-total. encouraged +2 assistance for RN staff. Author returned and assisted pt back to bed with OT    Ambulation/Gait  General Gait Details: unable    Balance Overall balance assessment: Needs assistance Sitting-balance support: Feet supported Sitting balance-Leahy Scale: Fair     Standing balance support: Bilateral upper extremity supported, During functional activity, Reliant on assistive device for balance Standing balance-Leahy Scale: Zero     Communication Communication Communication: No apparent difficulties  Cognition Arousal: Alert Behavior During Therapy: Anxious   PT - Cognitive impairments: History of cognitive impairments    PT - Cognition Comments: pt is alert but disoriented x 3. He agrees to OOB activity but does have severe anxiety/fear wih transfers and any OOB activity. Following commands: Intact      Cueing Cueing Techniques: Verbal cues   Pertinent Vitals/Pain Pain Assessment Pain Assessment: PAINAD Breathing: occasional labored breathing, short  period of hyperventilation Negative Vocalization: occasional  moan/groan, low speech, negative/disapproving quality Facial Expression: smiling or inexpressive Body Language: relaxed Consolability: distracted or reassured by voice/touch PAINAD Score: 3 Pain Location: L hip Pain Descriptors / Indicators: Aching, Discomfort, Grimacing, Operative site guarding Pain Intervention(s): Limited activity within patient's tolerance, Monitored during session, Premedicated before session, Repositioned     PT Goals (current goals can now be found in the care plan section) Acute Rehab PT Goals Patient Stated Goal: none stated Progress towards PT goals: Progressing toward goals    Frequency    7X/week       Co-evaluation     PT goals addressed during session: Mobility/safety with mobility;Balance;Proper use of DME;Strengthening/ROM        AM-PAC PT "6 Clicks" Mobility   Outcome Measure  Help needed turning from your back to your side while in a flat bed without using bedrails?: A Lot Help needed moving from lying on your back to sitting on the side of a flat bed without using bedrails?: A Lot Help needed moving to and from a bed to a chair (including a wheelchair)?: A Lot Help needed standing up from a chair using your arms (e.g., wheelchair or bedside chair)?: Total Help needed to walk in hospital room?: Total Help needed climbing 3-5 steps with a railing? : Total 6 Click Score: 9    End of Session   Activity Tolerance: Patient tolerated treatment well Patient left: in chair;with call bell/phone within reach;with chair alarm set Nurse Communication: Mobility status PT Visit Diagnosis: Unsteadiness on feet (R26.81);Other abnormalities of gait and mobility (R26.89);Muscle weakness (generalized) (M62.81);History of falling (Z91.81);Difficulty in walking, not elsewhere classified (R26.2);Pain Pain - Right/Left: Left Pain - part of body: Hip     Time: 0910-0928 PT Time Calculation  (min) (ACUTE ONLY): 18 min  Charges:    $Therapeutic Activity: 8-22 mins PT General Charges $$ ACUTE PT VISIT: 1 Visit                    Jetta Lout PTA 10/12/23, 12:48 PM

## 2023-10-13 DIAGNOSIS — D62 Acute posthemorrhagic anemia: Secondary | ICD-10-CM | POA: Diagnosis not present

## 2023-10-13 DIAGNOSIS — S72002S Fracture of unspecified part of neck of left femur, sequela: Secondary | ICD-10-CM | POA: Diagnosis not present

## 2023-10-13 DIAGNOSIS — L299 Pruritus, unspecified: Secondary | ICD-10-CM

## 2023-10-13 DIAGNOSIS — E1165 Type 2 diabetes mellitus with hyperglycemia: Secondary | ICD-10-CM | POA: Diagnosis not present

## 2023-10-13 DIAGNOSIS — N401 Enlarged prostate with lower urinary tract symptoms: Secondary | ICD-10-CM | POA: Diagnosis not present

## 2023-10-13 DIAGNOSIS — E43 Unspecified severe protein-calorie malnutrition: Secondary | ICD-10-CM | POA: Insufficient documentation

## 2023-10-13 LAB — GLUCOSE, CAPILLARY
Glucose-Capillary: 162 mg/dL — ABNORMAL HIGH (ref 70–99)
Glucose-Capillary: 233 mg/dL — ABNORMAL HIGH (ref 70–99)
Glucose-Capillary: 195 mg/dL — ABNORMAL HIGH (ref 70–99)
Glucose-Capillary: 222 mg/dL — ABNORMAL HIGH (ref 70–99)

## 2023-10-13 MED ORDER — ADULT MULTIVITAMIN W/MINERALS CH
1.0000 | ORAL_TABLET | Freq: Every day | ORAL | Status: DC
  Administered 2023-10-13 – 2023-10-14 (×2): 1 via ORAL
  Filled 2023-10-13 (×2): qty 1

## 2023-10-13 MED ORDER — GLUCERNA SHAKE PO LIQD
237.0000 mL | Freq: Three times a day (TID) | ORAL | Status: DC
  Administered 2023-10-13 – 2023-10-14 (×4): 237 mL via ORAL

## 2023-10-13 MED ORDER — DIPHENHYDRAMINE-ZINC ACETATE 2-0.1 % EX CREA
TOPICAL_CREAM | Freq: Two times a day (BID) | CUTANEOUS | Status: DC | PRN
  Filled 2023-10-13: qty 1

## 2023-10-13 NOTE — TOC Progression Note (Signed)
 Transition of Care [facility]) - Progression Note    Patient Details  Name: Troy Preston MRN: [id number] Date of Birth: 1933-12-27  [facility]) CM/SW Contact  Marlowe Sax, RN Phone Number: 10/13/2023, 2:31 PM  Clinical Narrative:    Spoke with the son [name] and provided Approval information, Spoke to Soy at [facility] and provided the information, she will call me with the Room number tomorrow Called [facility] to arrange transport for tomorrow they stated that the earliest they can get him would be 1130-12 tomorrow         Expected Discharge Plan and Services                                               Social Determinants of Health (SDOH) Interventions SDOH Screenings   Food Insecurity: No Food Insecurity (10/07/2023)  Housing: Low Risk  (10/07/2023)  Transportation Needs: No Transportation Needs (10/07/2023)  Utilities: Not At Risk (10/07/2023)  Financial Resource Strain: Low Risk  (06/03/2017)   Received from [facility], [facility]  Physical Activity: Unknown (06/03/2017)   Received from [facility], [facility]  Social Connections: Socially Isolated (10/07/2023)  Stress: No Stress Concern Present (06/03/2017)   Received from [facility], [facility]  Tobacco Use: High Risk (10/07/2023)    Readmission Risk Interventions     No data to display

## 2023-10-13 NOTE — Assessment & Plan Note (Signed)
 On back.  Spoke with nursing staff about setting him up and getting his back off the bed and getting some air on it.  As needed Benadryl cream.

## 2023-10-13 NOTE — Progress Notes (Signed)
 Progress Note   Patient: Troy Preston WJX:[id number] DOB: 1933-11-30 DOA: 10/07/2023     6 DOS: the patient was seen and examined on 10/13/2023   Brief hospital course: 89 y.o. male with medical history significant of IIDM, dementia, questionable Parkinson's disease, GERD, sent from nursing home for evaluation of hip pain after fall.   Patient has dementia and does not remember much what had happened.  Most history provided by son over the phone.  Son/POA reported that patient has chronic ambulation impairment uses roller walker to ambulate despite he has been having increasing bilateral lower extremity weakness and shakiness, his PCP has been concerned about new onset of Parkinson's and the patient has had multiple fall before today's episode.  Patient has been using testosterone cream rubbing daily on both legs with minimal improvement of bilateral lower extremity weakness.  At baseline patient appears to have fair exercise tolerance, can usually walk 10 minutes without chest pain or shortness of breath.  No history of CAD, no complaining of chest pain or lightheadedness before today.  Appears that the patient fell yesterday in the facility and woke up this morning complaining about severe pain of the left hip and unable to put weight on. ED Course: Afebrile, not tachycardia blood pressure elevated 150/76.  CT head and neck negative for fracture or dislocation.  X-ray of the hip showed dislocated femoral neck fracture on the left side.  Blood work showed glucose 314, hemoglobin 12, creatinine 1.2, BUN 21.  3/22.  Left hip hemiarthroplasty with Stryker Accolade prosthesis done by Dr. Hyacinth Meeker. 3/26.  Awaiting to hear back on facilities and then will need insurance authorization.  Hemoglobin 9.4. 3/27.  Patient complaining of itching on his back.  No rash seen.  As needed Benadryl cream.  Notified that facility will be able to take him tomorrow.   Assessment and Plan: * Closed hip fracture  requiring operative repair, left, sequela Left hip hemiarthroplasty done by Dr. Hyacinth Meeker on 3/22.  Pain control.  Will discharge to facility tomorrow.  Acute blood loss anemia Hemoglobin stabilized at 9.4.  Uncontrolled type 2 diabetes mellitus with hyperglycemia, without long-term current use of insulin (HCC) Hemoglobin A1c actually very good at 6.6.  Patient on sliding scale insulin.  At home takes metformin.  BPH with obstruction/lower urinary tract symptoms Patient on Flomax  History of esophagitis On PPI  Hard of hearing .  Itching On back.  Spoke with nursing staff about setting him up and getting his back off the bed and getting some air on it.  As needed Benadryl cream.  Protein-calorie malnutrition, severe Continue supplements  Hyponatremia Sodium 1 point less than the normal range.        Subjective: Patient complaining of some itching on his back.  Admitted with hip fracture.  Physical Exam: Vitals:   10/12/23 1458 10/12/23 2055 10/13/23 0519 10/13/23 0752  BP: (!) 119/51 129/62 120/80 119/66  Pulse: 76 (!) 55 79 73  Resp: 16 18 18 16   Temp: (!) 97.4 F (36.3 C) 98.4 F (36.9 C) 97.8 F (36.6 C) 98.6 F (37 C)  TempSrc: Oral   Oral  SpO2: 99% 94% 97% 100%  Weight:      Height:       Physical Exam HENT:     Head: Normocephalic.     Mouth/Throat:     Pharynx: No oropharyngeal exudate.  Eyes:     General: Lids are normal.     Conjunctiva/sclera: Conjunctivae normal.  Cardiovascular:  Rate and Rhythm: Normal rate and regular rhythm.     Heart sounds: Normal heart sounds, S1 normal and S2 normal.  Pulmonary:     Breath sounds: No decreased breath sounds, wheezing, rhonchi or rales.  Abdominal:     Palpations: Abdomen is soft.     Tenderness: There is no abdominal tenderness.  Musculoskeletal:     Right lower leg: No swelling.     Left lower leg: No swelling.  Skin:    General: Skin is warm.     Comments: No rash on back.  Neurological:      Mental Status: He is alert.     Comments: Able to lift legs up off the chair.     Data Reviewed: Last hemoglobin 9.4  Family Communication: Updated son on the phone  Disposition: Status is: Inpatient Remains inpatient appropriate because: Out to facility on 3/28  Planned Discharge Destination: Long-term care    Time spent: 28 minutes  Author: Alford Highland, MD 10/13/2023 2:31 PM  For on call review [email or web address].

## 2023-10-13 NOTE — Plan of Care (Signed)
  Problem: Education: Goal: Ability to describe self-care measures that may prevent or decrease complications (Diabetes Survival Skills Education) will improve Outcome: Progressing   Problem: Skin Integrity: Goal: Risk for impaired skin integrity will decrease Outcome: Progressing   Problem: Activity: Goal: Risk for activity intolerance will decrease Outcome: Progressing   Problem: Safety: Goal: Ability to remain free from injury will improve Outcome: Progressing   Problem: Pain Management: Goal: Pain level will decrease Outcome: Progressing

## 2023-10-13 NOTE — TOC Progression Note (Signed)
 Transition of Care [facility]) - Progression Note    Patient Details  Name: Troy Preston MRN: [id number] Date of Birth: 1933-12-24  [facility]) CM/SW Contact  Marlowe Sax, RN Phone Number: 10/13/2023, 11:46 AM  Clinical Narrative:    Spoke with son jeff and S[facility] from [facility], he was accepted at [facility] he is able to DC tomorrow, ins approved  10/14/2023-10/18/2023 Approved He will need EMS        Expected Discharge Plan and Services                                               Social Determinants of Health (SDOH) Interventions SDOH Screenings   Food Insecurity: No Food Insecurity (10/07/2023)  Housing: Low Risk  (10/07/2023)  Transportation Needs: No Transportation Needs (10/07/2023)  Utilities: Not At Risk (10/07/2023)  Financial Resource Strain: Low Risk  (06/03/2017)   Received from [facility], [facility]  Physical Activity: Unknown (06/03/2017)   [facility], [facility]  Social Connections: Socially Isolated (10/07/2023)  Stress: No Stress Concern Present (06/03/2017)   Received from [facility], [facility]  Tobacco Use: High Risk (10/07/2023)    Readmission Risk Interventions     No data to display

## 2023-10-13 NOTE — Progress Notes (Signed)
 Nutrition Follow-up  DOCUMENTATION CODES:   Severe malnutrition in context of chronic illness  INTERVENTION:   -Continue carb modified diet -MVI with minerals daily -Glucerna Shake po TID, each supplement provides 220 kcal and 10 grams of protein  -Magic cup TID with meals, each supplement provides 290 kcal and 9 grams of protein   NUTRITION DIAGNOSIS:   Severe Malnutrition related to chronic illness (dementia) as evidenced by moderate fat depletion, severe fat depletion, moderate muscle depletion, severe muscle depletion, percent weight loss.  Ongoing  GOAL:   Patient will meet greater than or equal to 90% of their needs  Progressing   MONITOR:   PO intake, Supplement acceptance  REASON FOR ASSESSMENT:   Consult Hip fracture protocol  ASSESSMENT:   89 y.o. male with medical history significant of DM, dementia, questionable Parkinson's disease, GERD, BPH and HOH who is admitted with hip fracture after fall.  3/22- s/p PROCEDURE: Left   hip hemiarthroplasty with Stryker Accolade prosthesis    Case discussed with PT, who just finished with pt and reports that pt did well in session.   Spoke with pt at bedside, who reports good appetite. Pt complains that he feels hungry after consuming all of his breakfast. Noted meal completions 50-100%. Pt consumed 75% of of breakfast. PT provided peanut butter and graham crackers. Pt very appreciative.   Pt reports he was from [facility] P[facility]. He confirms he was on a no concentrated sweets diet and ate well, most of the food from his recollection. Pt complains of being hungry; he reports that he was drinking supplements PTA, but unsure which ones. Discussed importance of good meal and supplement intake to promote healing. Pt amenable to supplements.   Reviewed wt hx; pt has experienced a 25.1% wt loss over the past 6 months, which is significant for time frame.   Per TOC notes, plan to discharge to SNF [facility]) tomorrow.    Medications reviewed and include colace, lovenox, ferrous sulfate, neurontin, and protonix.   Lab Results  Component Value Date   HGBA1C 6.6 (H) 10/07/2023   PTA DM medications are 1000 mg metformin daily.   Labs reviewed: Na: 134, BGS: 155-225 (inpatient orders for glycemic control are 0-15 units insulin aspart TID with meals, 0-5 units insulin aspart daily at bedtime, ).    NUTRITION - FOCUSED PHYSICAL EXAM:  Flowsheet Row Most Recent Value  Orbital Region Severe depletion  Upper Arm Region Severe depletion  Thoracic and Lumbar Region Moderate depletion  Buccal Region Severe depletion  Temple Region Severe depletion  Clavicle Bone Region Severe depletion  Clavicle and Acromion Bone Region Severe depletion  Scapular Bone Region Severe depletion  Dorsal Hand Moderate depletion  Patellar Region Moderate depletion  Anterior Thigh Region Moderate depletion  Posterior Calf Region Moderate depletion  Edema (RD Assessment) None  Hair Reviewed  Eyes Reviewed  Mouth Reviewed  Skin Reviewed  Nails Reviewed       Diet Order:   Diet Order             Diet Carb Modified Fluid consistency: Thin  Diet effective now                   EDUCATION NEEDS:   Education needs have been addressed  Skin:  Skin Assessment: Skin Integrity Issues: Skin Integrity Issues:: Incisions Incisions: closed lt hip  Last BM:  10/10/23  Height:   Ht Readings from Last 1 Encounters:  10/07/23 5\' 9"  (1.753 m)  Weight:   Wt Readings from Last 1 Encounters:  10/07/23 63.5 kg    Ideal Body Weight:  72.7 kg  BMI:  Body mass index is 20.67 kg/m.  Estimated Nutritional Needs:   Kcal:  1900-2100  Protein:  90-105 grams  Fluid:  1.9-2.1 L    Levada Schilling, RD, LDN, CDCES Registered Dietitian III Certified Diabetes Care and Education Specialist If unable to reach this RD, please use "RD Inpatient" group chat on secure chat between hours of 8am-4 pm daily

## 2023-10-13 NOTE — Progress Notes (Signed)
 Patient pulled out PIV. Dr Renae Gloss  made aware. OK to be without PIV.

## 2023-10-13 NOTE — Assessment & Plan Note (Signed)
 Continue supplements

## 2023-10-13 NOTE — TOC Progression Note (Signed)
 Transition of Care [facility]) - Progression Note    Patient Details  Name: Troy Preston MRN: [id number] Date of Birth: 18-Oct-1933  Transition of Care [facility]) CM/SW Contact  Marlowe Sax, RN Phone Number: 10/13/2023, 9:12 AM  Clinical Narrative:    Spoke with Ramond Dial, he has a meeting at 1030 at [facility] to see if they are going to accept his dad (the Patient) for Long term care, he will call me after the mtg        Expected Discharge Plan and Services                                               Social Determinants of Health (SDOH) Interventions SDOH Screenings   Food Insecurity: No Food Insecurity (10/07/2023)  Housing: Low Risk  (10/07/2023)  Transportation Needs: No Transportation Needs (10/07/2023)  Utilities: Not At Risk (10/07/2023)  Financial Resource Strain: Low Risk  (06/03/2017)   Received from [facility], [facility]  Physical Activity: Unknown (06/03/2017)   Received from [facility], [facility]  Social Connections: Socially Isolated (10/07/2023)  Stress: No Stress Concern Present (06/03/2017)   Received from [facility], [facility]  Tobacco Use: High Risk (10/07/2023)    Readmission Risk Interventions     No data to display

## 2023-10-13 NOTE — Progress Notes (Signed)
 Physical Therapy Treatment Patient Details Name: Troy Preston MRN: [id number] DOB: [birth year] Today's Date: 10/13/2023   History of Present Illness Patient is an 89 year old male sent from nursing home s/p fall with hip pain. PMH includes type II MD, dementia, possible Parkinson's disease, GERD, wears hearing aides. Patient underwent L hip hemiarthroplasty on 10/08/23.    PT Comments  Pt was long sitting in bed, anxious, upon arrival. Pt very disoriented and in slight panic however easily reoriented and calmed with only educating pt on current situation, location, and POC. Pt then remains cooperative, calm and pleasant however very limited by his fear of falling in standing. Pt present with severe posterior push with knees flexed and poor posture. Recommend +2 assistance or use of mechanical lift for any OOB activity. Pt will continue to benefit from skilled PT to maximize his independence and safety with all ADLs.    If plan is discharge home, recommend the following: A lot of help with walking and/or transfers;A lot of help with bathing/dressing/bathroom;Assistance with cooking/housework;Assistance with feeding;Direct supervision/assist for medications management;Direct supervision/assist for financial management;Assist for transportation;Help with stairs or ramp for entrance;Supervision due to cognitive status     Equipment Recommendations  Other (comment) (Defer to next level of care)       Precautions / Restrictions Precautions Precautions: Fall;Posterior Hip Precaution Booklet Issued: Yes (comment) Recall of Precautions/Restrictions: Impaired Restrictions Weight Bearing Restrictions Per Provider Order: Yes LLE Weight Bearing Per Provider Order: Weight bearing as tolerated Other Position/Activity Restrictions: no crossing limb, no >90     Mobility  Bed Mobility Overal bed mobility: Needs Assistance Bed Mobility: Supine to Sit, Sit to Supine Rolling: Max assist Supine to sit:  Max assist, Used rails Sit to supine: Max assist   Transfers Overall transfer level: Needs assistance Equipment used: Rolling walker (2 wheels) Transfers: Sit to/from Stand Sit to Stand: Max assist, From elevated surface  General transfer comment: pt stood EOB 3 x with bed height elevated and max vc+ assistance. pt remains very fearful of falling with constant relaxation cues and to fwd wt shift. pt has flexed knees and posterior push in standing. needs max encouragement to remain standing due to fear of falling. stood 3 x EOB prior to author assisting pt back to supien in bed.    Ambulation/Gait  General Gait Details: remains unable    Balance Overall balance assessment: Needs assistance Sitting-balance support: Feet supported Sitting balance-Leahy Scale: Fair     Standing balance support: Bilateral upper extremity supported, During functional activity, Reliant on assistive device for balance Standing balance-Leahy Scale: Zero Standing balance comment: continues to require max assist to maintain standing for very short periods. poor standing tolerance,poor posture, and poor balance       Communication Communication Communication: Impaired Factors Affecting Communication: Hearing impaired  Cognition Arousal: Alert Behavior During Therapy: WFL for tasks assessed/performed, Anxious (gets anxious with mobility and transfers)   PT - Cognitive impairments: History of cognitive impairments    PT - Cognition Comments: Pt was A but remains only oriented to self. upon arrival, pt very anxious/disoriented and in slight panic however easily reoriented and calmed once author expained situation. tp wa pleasant throughout. HOH but hears better out of L ear Following commands: Intact      Cueing Cueing Techniques: Verbal cues, Tactile cues         Pertinent Vitals/Pain Pain Assessment Pain Assessment: PAINAD Breathing: normal Negative Vocalization: occasional moan/groan, low speech,  negative/disapproving quality Facial Expression: smiling or  inexpressive Body Language: relaxed Consolability: no need to console PAINAD Score: 1 Pain Location: L hip Pain Descriptors / Indicators: Aching, Discomfort, Grimacing, Operative site guarding Pain Intervention(s): Limited activity within patient's tolerance, Monitored during session, Premedicated before session, Repositioned     PT Goals (current goals can now be found in the care plan section) Acute Rehab PT Goals Patient Stated Goal: none stated Progress towards PT goals: Not progressing toward goals - comment (fear of falling and poor cognition grealy limiting PT progress)    Frequency    7X/week       Co-evaluation     PT goals addressed during session: Mobility/safety with mobility;Balance;Proper use of DME;Strengthening/ROM        AM-PAC PT "6 Clicks" Mobility   Outcome Measure  Help needed turning from your back to your side while in a flat bed without using bedrails?: A Lot Help needed moving from lying on your back to sitting on the side of a flat bed without using bedrails?: A Lot Help needed moving to and from a bed to a chair (including a wheelchair)?: A Lot Help needed standing up from a chair using your arms (e.g., wheelchair or bedside chair)?: Total Help needed to walk in hospital room?: Total Help needed climbing 3-5 steps with a railing? : Total 6 Click Score: 9    End of Session   Activity Tolerance: Patient tolerated treatment well Patient left: in bed;with call bell/phone within reach;with bed alarm set Nurse Communication: Mobility status PT Visit Diagnosis: Unsteadiness on feet (R26.81);Other abnormalities of gait and mobility (R26.89);Muscle weakness (generalized) (M62.81);History of falling (Z91.81);Difficulty in walking, not elsewhere classified (R26.2);Pain Pain - Right/Left: Left Pain - part of body: Hip     Time: 8469-6295 PT Time Calculation (min) (ACUTE ONLY): 20  min  Charges:    $Therapeutic Activity: 8-22 mins PT General Charges $$ ACUTE PT VISIT: 1 Visit                    Jetta Lout PTA 10/13/23, 9:25 AM

## 2023-10-14 DIAGNOSIS — S72002S Fracture of unspecified part of neck of left femur, sequela: Secondary | ICD-10-CM | POA: Diagnosis not present

## 2023-10-14 DIAGNOSIS — N401 Enlarged prostate with lower urinary tract symptoms: Secondary | ICD-10-CM | POA: Diagnosis not present

## 2023-10-14 DIAGNOSIS — E1165 Type 2 diabetes mellitus with hyperglycemia: Secondary | ICD-10-CM | POA: Diagnosis not present

## 2023-10-14 DIAGNOSIS — D62 Acute posthemorrhagic anemia: Secondary | ICD-10-CM | POA: Diagnosis not present

## 2023-10-14 LAB — CBC
HCT: 27.4 % — ABNORMAL LOW (ref 39.0–52.0)
Hemoglobin: 9.1 g/dL — ABNORMAL LOW (ref 13.0–17.0)
MCH: 30.1 pg (ref 26.0–34.0)
MCHC: 33.2 g/dL (ref 30.0–36.0)
MCV: 90.7 fL (ref 80.0–100.0)
Platelets: 255 10*3/uL (ref 150–400)
RBC: 3.02 MIL/uL — ABNORMAL LOW (ref 4.22–5.81)
RDW: 13.3 % (ref 11.5–15.5)
WBC: 6.7 10*3/uL (ref 4.0–10.5)
nRBC: 0 % (ref 0.0–0.2)

## 2023-10-14 LAB — BASIC METABOLIC PANEL WITH GFR
Anion gap: 7 (ref 5–15)
BUN: 27 mg/dL — ABNORMAL HIGH (ref 8–23)
CO2: 25 mmol/L (ref 22–32)
Calcium: 8.4 mg/dL — ABNORMAL LOW (ref 8.9–10.3)
Chloride: 104 mmol/L (ref 98–111)
Creatinine, Ser: 0.94 mg/dL (ref 0.61–1.24)
GFR, Estimated: >60 mL/min (ref >60)
Glucose, Bld: 234 mg/dL — ABNORMAL HIGH (ref 70–99)
Potassium: 4.2 mmol/L (ref 3.5–5.1)
Sodium: 136 mmol/L (ref 135–145)

## 2023-10-14 LAB — GLUCOSE, CAPILLARY: Glucose-Capillary: 222 mg/dL — ABNORMAL HIGH (ref 70–99)

## 2023-10-14 MED ORDER — GLUCERNA SHAKE PO LIQD: 237.0000 mL | Freq: Three times a day (TID) | ORAL | 0 refills | Status: AC

## 2023-10-14 MED ORDER — TESTOSTERONE 20.25 MG/1.25GM (1.62%) TD GEL: 20.0000 | Freq: Every day | TRANSDERMAL | 0 refills | Status: DC

## 2023-10-14 MED ORDER — ASPIRIN 81 MG PO TBEC
81.0000 mg | DELAYED_RELEASE_TABLET | Freq: Two times a day (BID) | ORAL | 0 refills | Status: AC
Start: 2023-10-14 — End: 2023-12-09

## 2023-10-14 MED ORDER — DOCUSATE SODIUM 100 MG PO CAPS
100.0000 mg | ORAL_CAPSULE | Freq: Two times a day (BID) | ORAL | 0 refills | Status: AC
Start: 2023-10-14 — End: ?

## 2023-10-14 MED ORDER — HYDROCODONE-ACETAMINOPHEN 5-325 MG PO TABS
1.0000 | ORAL_TABLET | Freq: Four times a day (QID) | ORAL | 0 refills | Status: AC | PRN
Start: 2023-10-14 — End: ?

## 2023-10-14 MED ORDER — HYDROCODONE-ACETAMINOPHEN 5-325 MG PO TABS: 1.0000 | ORAL_TABLET | Freq: Four times a day (QID) | ORAL | 0 refills | Status: DC | PRN

## 2023-10-14 MED ORDER — FERROUS SULFATE 325 (65 FE) MG PO TABS: 325.0000 mg | ORAL_TABLET | Freq: Every day | ORAL | 0 refills | Status: AC

## 2023-10-14 MED ORDER — DIPHENHYDRAMINE-ZINC ACETATE 2-0.1 % EX CREA: TOPICAL_CREAM | Freq: Two times a day (BID) | CUTANEOUS | 0 refills | Status: AC | PRN

## 2023-10-14 MED ORDER — ASPIRIN 81 MG PO TBEC
81.0000 mg | DELAYED_RELEASE_TABLET | Freq: Two times a day (BID) | ORAL | 0 refills | Status: DC
Start: 2023-10-14 — End: 2023-10-14

## 2023-10-14 MED ORDER — ADULT MULTIVITAMIN W/MINERALS CH: 1.0000 | ORAL_TABLET | Freq: Every day | ORAL | Status: AC

## 2023-10-14 MED ORDER — TESTOSTERONE 20.25 MG/1.25GM (1.62%) TD GEL: 20.0000 | Freq: Every day | TRANSDERMAL | 0 refills | Status: AC

## 2023-10-14 MED ORDER — ACETAMINOPHEN 325 MG PO TABS: 650.0000 mg | ORAL_TABLET | Freq: Four times a day (QID) | ORAL | Status: AC | PRN

## 2023-10-14 NOTE — TOC Transition Note (Addendum)
 Transition of Care [facility]) - Discharge Note   Patient Details  Name: Troy Preston MRN: [id number] Date of Birth: 1934-06-17  [facility]) CM/SW Contact:  Marlowe Sax, RN Phone Number: 10/14/2023, 9:03 AM   Clinical Narrative:     The patient is approved to go to [facility] today to room 405B, [facility] arranged to transport between 11-12, Notified son [name]        Patient Goals and CMS Choice            Discharge Placement                       Discharge Plan and Services Additional resources added to the After Visit Summary for                                       Social Drivers of Health (SDOH) Interventions SDOH Screenings   Food Insecurity: No Food Insecurity (10/07/2023)  Housing: Low Risk  (10/07/2023)  Transportation Needs: No Transportation Needs (10/07/2023)  Utilities: Not At Risk (10/07/2023)  Financial Resource Strain: Low Risk  (06/03/2017)   Received from [facility], [facility]  Physical Activity: Unknown (06/03/2017)   Received from [facility], [facility]  Social Connections: Socially Isolated (10/07/2023)  Stress: No Stress Concern Present (06/03/2017)   Received from [facility], [facility]  Tobacco Use: High Risk (10/07/2023)     Readmission Risk Interventions     No data to display

## 2023-10-14 NOTE — Discharge Summary (Signed)
 Physician Discharge Summary   Patient: Troy Preston MRN: [id number] DOB: Aug 02, 1933  Admit date:     10/07/2023  Discharge date: 10/14/23  Discharge Physician: Alford Highland   PCP: Barbette Reichmann, MD   Recommendations at discharge:    Follow up with team at facility Follow up with DR Hyacinth Meeker orthopedics Check cbc one week  Discharge Diagnoses: Principal Problem:   Closed hip fracture requiring operative repair, left, sequela Active Problems:   Uncontrolled type 2 diabetes mellitus with hyperglycemia, without long-term current use of insulin (HCC)   Acute blood loss anemia   BPH with obstruction/lower urinary tract symptoms   History of esophagitis   Hard of hearing   Hypogonadism in male   Hyponatremia   Protein-calorie malnutrition, severe   Itching    Hospital Course: 89 y.o. male with medical history significant of IIDM, dementia, questionable Parkinson's disease, GERD, sent from nursing home for evaluation of hip pain after fall.   Patient has dementia and does not remember much what had happened.  Most history provided by son over the phone.  Son/POA reported that patient has chronic ambulation impairment uses roller walker to ambulate despite he has been having increasing bilateral lower extremity weakness and shakiness, his PCP has been concerned about new onset of Parkinson's and the patient has had multiple fall before today's episode.  Patient has been using testosterone cream rubbing daily on both legs with minimal improvement of bilateral lower extremity weakness.  At baseline patient appears to have fair exercise tolerance, can usually walk 10 minutes without chest pain or shortness of breath.  No history of CAD, no complaining of chest pain or lightheadedness before today.  Appears that the patient fell yesterday in the facility and woke up this morning complaining about severe pain of the left hip and unable to put weight on. ED Course: Afebrile, not  tachycardia blood pressure elevated 150/76.  CT head and neck negative for fracture or dislocation.  X-ray of the hip showed dislocated femoral neck fracture on the left side.  Blood work showed glucose 314, hemoglobin 12, creatinine 1.2, BUN 21.  3/22.  Left hip hemiarthroplasty with [employer] Accolade prosthesis done by Dr. Hyacinth Meeker. 3/26.  Awaiting to hear back on facilities and then will need insurance authorization.  Hemoglobin 9.4. 3/27.  Patient complaining of itching on his back.  No rash seen.  As needed Benadryl cream.  Notified that facility will be able to take him tomorrow. 3/28.  Patient stable for discharge to facility.  Hemoglobin 9.1.  Orthopedic surgery recommended aspirin 81 mg twice a day for 8 weeks for DVT prophylaxis upon discharge.  They can stop this medication after that.  Assessment and Plan: * Closed hip fracture requiring operative repair, left, sequela Left hip hemiarthroplasty done by Dr. Hyacinth Meeker on 3/22.  Pain control.  Convert to Tylenol as quickly as possible.  Keep working with physical therapy.  Acute blood loss anemia Hemoglobin stabilized at 9.1.  Patient on iron supplementation.  Uncontrolled type 2 diabetes mellitus with hyperglycemia, without long-term current use of insulin (HCC) Hemoglobin A1c actually very good at 6.6.  Can go back on metformin  BPH with obstruction/lower urinary tract symptoms Patient on Flomax  History of esophagitis On PPI  Hard of hearing .  Itching On back.  Spoke with nursing staff about setting him up and getting his back off the bed and getting some air on it.  As needed Benadryl cream.  Protein-calorie malnutrition, severe Continue supplements  Hyponatremia  Sodium normalized  Hypogonadism in male Can go back on testosterone gel.         Consultants: Orthopedic surgery Procedures performed: Left hip hemiarthroplasty on 3/22 Disposition: Rehabilitation facility Diet recommendation:  Carb modified  diet DISCHARE MEDICATION: Allergies as of 10/14/2023   No Known Allergies      Medication List     STOP taking these medications    bisacodyl 5 MG EC tablet Commonly known as: DULCOLAX   Cinnamon 500 MG capsule   Clinpro 5000 1.1 % Pste Generic drug: Sodium Fluoride   cyanocobalamin 500 MCG tablet Commonly known as: VITAMIN B12   ibuprofen 100 MG tablet Commonly known as: ADVIL   ketoconazole 2 % cream Commonly known as: NIZORAL   LORazepam 0.5 MG tablet Commonly known as: ATIVAN   polyethylene glycol powder 17 GM/SCOOP powder Commonly known as: GLYCOLAX/MIRALAX   PSYLLIUM FIBER PO   senna-docusate 8.6-50 MG tablet Commonly known as: Senokot-S       TAKE these medications    acetaminophen 325 MG tablet Commonly known as: TYLENOL Take 2 tablets (650 mg total) by mouth every 6 (six) hours as needed for moderate pain (pain score 4-6) or mild pain (pain score 1-3). What changed: reasons to take this   aspirin EC 81 MG tablet Commonly known as: Aspirin 81 Take 1 tablet (81 mg total) by mouth 2 (two) times daily. What changed:  medication strength how much to take when to take this   CERAVE MOISTURIZING EX Apply 1 Application topically daily.   diphenhydrAMINE-zinc acetate cream Commonly known as: BENADRYL Apply topically 2 (two) times daily as needed for itching.   docusate sodium 100 MG capsule Commonly known as: COLACE Take 1 capsule (100 mg total) by mouth 2 (two) times daily.   feeding supplement (GLUCERNA SHAKE) Liqd Take 237 mLs by mouth 3 (three) times daily between meals.   ferrous sulfate 325 (65 FE) MG tablet Take 1 tablet (325 mg total) by mouth daily with breakfast. Start taking on: October 15, 2023   HYDROcodone-acetaminophen 5-325 MG tablet Commonly known as: NORCO/VICODIN Take 1 tablet by mouth every 6 (six) hours as needed for severe pain (pain score 7-10).   melatonin 5 MG Tabs Take 5 mg by mouth at bedtime.   metFORMIN 1000  MG tablet Commonly known as: GLUCOPHAGE Take 1,000 mg by mouth daily with breakfast.   Milk of Magnesia 1200 MG/15ML suspension Generic drug: magnesium hydroxide Take 30 mLs by mouth daily as needed for moderate constipation (if no bowel movement in past 24 hours).   multivitamin with minerals Tabs tablet Take 1 tablet by mouth daily. Start taking on: October 15, 2023   omeprazole 20 MG capsule Commonly known as: PRILOSEC Take 20 mg by mouth.   tamsulosin 0.4 MG Caps capsule Commonly known as: FLOMAX Take 0.4 mg by mouth at bedtime.   Testosterone 20.25 MG/1.25GM (1.62%) Gel Commonly known as: AndroGel Apply 20 each topically daily. Patient needs to apply 2 pumps daily        Contact information for follow-up providers     Deeann Saint, MD Follow up in 3 week(s).   Specialty: Orthopedic Surgery Why: For suture removal, For wound re-check, For re-evaluation  Please make an appointment for this patient prior to his leaving the hospital Contact information: [address] [place] [address] [phone]              Contact information for after-discharge care     Destination  [facility] .   Service: Skilled Nursing Contact information: [address] [place] [address] [phone]                    Discharge Exam: Ceasar Mons Weights   10/07/23 1357  Weight: 63.5 kg   Physical Exam HENT:     Head: Normocephalic.     Mouth/Throat:     Pharynx: No oropharyngeal exudate.  Eyes:     General: Lids are normal.     Conjunctiva/sclera: Conjunctivae normal.  Cardiovascular:     Rate and Rhythm: Normal rate and regular rhythm.     Heart sounds: Normal heart sounds, S1 normal and S2 normal.  Pulmonary:     Breath sounds: No decreased breath sounds, wheezing, rhonchi or rales.  Abdominal:     Palpations: Abdomen is soft.     Tenderness: There is no abdominal tenderness.  Musculoskeletal:     Right lower  leg: No swelling.     Left lower leg: No swelling.  Skin:    General: Skin is warm.     Comments: No rash on back.  Neurological:     Mental Status: He is alert.     Comments: Able to lift legs up off the chair.      Condition at discharge: stable  The results of significant diagnostics from this hospitalization (including imaging, microbiology, ancillary and laboratory) are listed below for reference.   Imaging Studies: DG HIP UNILAT W OR W/O PELVIS 2-3 VIEWS LEFT Result Date: 10/08/2023 CLINICAL DATA:  Postoperative closed left hip fracture EXAM: DG HIP (WITH OR WITHOUT PELVIS) 2-3V LEFT COMPARISON:  10/07/2023 FINDINGS: Interval postoperative changes with placement of a left hip hemiarthroplasty using non cemented components. Components appear well seated. No evidence of new acute fracture or dislocation. Soft tissue gas and skin clips are consistent with recent surgery. Degenerative changes demonstrated in the lower lumbar spine and right hip. IMPRESSION: Interval left hip replacement. No acute complication is suggested radiographically. Electronically Signed   By: Burman Nieves M.D.   On: 10/08/2023 19:03   ECHOCARDIOGRAM COMPLETE Result Date: 10/08/2023    ECHOCARDIOGRAM REPORT   Patient Name:   Troy Preston Date of Exam: 10/08/2023 Medical Rec #:  [id number]       Height:       69.0 in Accession #:    [id number]      Weight:       140.0 lb Date of Birth:  June 22, 1934      BSA:          1.775 m Patient Age:    89 years        BP:           140/65 mmHg Patient Gender: M               HR:           68 bpm. Exam Location:  [facility] Procedure: 2D Echo, Cardiac Doppler and Color Doppler (Both Spectral and Color            Flow Doppler were utilized during procedure). Indications:     Preoperative evaluation  History:         Patient has no prior history of Echocardiogram examinations.  Sonographer:     Elwin Sleight RDCS Referring Phys:  [id number] Emeline General Diagnosing Phys: Debbe Odea MD   Sonographer Comments: Suboptimal apical window and suboptimal subcostal window. Image acquisition challenging due to respiratory motion.  IMPRESSIONS  1. Left ventricular ejection fraction, by estimation, is 60 to 65%. The left ventricle has normal function. The left ventricle has no regional wall motion abnormalities. Left ventricular diastolic parameters are consistent with Grade I diastolic dysfunction (impaired relaxation).  2. Right ventricular systolic function is normal. The right ventricular size is normal.  3. The mitral valve is normal in structure. Trivial mitral valve regurgitation.  4. The aortic valve is tricuspid. Aortic valve regurgitation is mild. Aortic valve sclerosis/calcification is present, without any evidence of aortic stenosis.  5. The inferior vena cava is normal in size with greater than 50% respiratory variability, suggesting right atrial pressure of 3 mmHg. FINDINGS  Left Ventricle: Left ventricular ejection fraction, by estimation, is 60 to 65%. The left ventricle has normal function. The left ventricle has no regional wall motion abnormalities. Strain was performed and the global longitudinal strain is indeterminate. The left ventricular internal cavity size was normal in size. There is no left ventricular hypertrophy. Left ventricular diastolic parameters are consistent with Grade I diastolic dysfunction (impaired relaxation). Right Ventricle: The right ventricular size is normal. No increase in right ventricular wall thickness. Right ventricular systolic function is normal. Left Atrium: Left atrial size was normal in size. Right Atrium: Right atrial size was normal in size. Pericardium: There is no evidence of pericardial effusion. Mitral Valve: The mitral valve is normal in structure. Trivial mitral valve regurgitation. Tricuspid Valve: The tricuspid valve is normal in structure. Tricuspid valve regurgitation is trivial. Aortic Valve: The aortic valve is tricuspid. Aortic valve  regurgitation is mild. Aortic valve sclerosis/calcification is present, without any evidence of aortic stenosis. Aortic valve peak gradient measures 2.5 mmHg. Pulmonic Valve: The pulmonic valve was normal in structure. Pulmonic valve regurgitation is trivial. Aorta: The aortic root and ascending aorta are structurally normal, with no evidence of dilitation. Venous: The inferior vena cava is normal in size with greater than 50% respiratory variability, suggesting right atrial pressure of 3 mmHg. IAS/Shunts: No atrial level shunt detected by color flow Doppler. Additional Comments: 3D was performed not requiring image post processing on an independent workstation and was indeterminate.  LEFT VENTRICLE PLAX 2D LVIDd:         3.90 cm   Diastology LVIDs:         2.80 cm   LV e' medial:    9.01 cm/s LV PW:         1.00 cm   LV E/e' medial:  6.8 LV IVS:        1.10 cm   LV e' lateral:   11.70 cm/s LVOT diam:     2.00 cm   LV E/e' lateral: 5.2 LV SV:         55 LV SV Index:   31 LVOT Area:     3.14 cm  RIGHT VENTRICLE RV Basal diam:  3.40 cm LEFT ATRIUM           Index        RIGHT ATRIUM           Index LA diam:      3.30 cm 1.86 cm/m   RA Area:     11.30 cm LA Vol (A4C): 31.8 ml 17.91 ml/m  RA Volume:   30.80 ml  17.35 ml/m  AORTIC VALVE                 PULMONIC VALVE AV Area (Vmax): 3.46 cm     PV Vmax:  0.87 m/s AV Vmax:        78.80 cm/s   PV Peak grad:     3.0 mmHg AV Peak Grad:   2.5 mmHg     PR End Diast Vel: 2.89 msec LVOT Vmax:      86.90 cm/s   RVOT Peak grad:   2 mmHg LVOT Vmean:     53.800 cm/s LVOT VTI:       0.176 m  AORTA Ao Root diam: 3.30 cm Ao Asc diam:  3.40 cm MITRAL VALVE               TRICUSPID VALVE MV Area (PHT): 3.89 cm    TR Peak grad:   41.2 mmHg MV Decel Time: 195 msec    TR Vmax:        321.00 cm/s MV E velocity: 60.90 cm/s MV A velocity: 85.60 cm/s  SHUNTS MV E/A ratio:  0.71        Systemic VTI:  0.18 m                            Systemic Diam: 2.00 cm Debbe Odea MD  Electronically signed by Debbe Odea MD Signature Date/Time: 10/08/2023/2:10:13 PM    Final    DG Chest Portable 1 View Result Date: 10/07/2023 CLINICAL DATA:  Status post fall, left hip fracture. EXAM: PORTABLE CHEST 1 VIEW COMPARISON:  None available. FINDINGS: Lung volumes are low. Upper normal heart size. Mild aortic atherosclerosis and tortuosity. No focal airspace disease, pulmonary edema, pleural effusion or pneumothorax. No displaced rib fracture. IMPRESSION: Low lung volumes without acute chest findings. Electronically Signed   By: Narda Rutherford M.D.   On: 10/07/2023 15:57   DG HIP UNILAT WITH PELVIS 2-3 VIEWS LEFT Result Date: 10/07/2023 CLINICAL DATA:  Fall yesterday with left hip pain. EXAM: DG HIP (WITH OR WITHOUT PELVIS) 2-3V LEFT COMPARISON:  None Available. FINDINGS: Displaced left femoral neck fracture. Slight proximal migration of the femoral shaft. Apex anterior angulation. The femoral head remains seated. Bony pelvis is intact including pubic rami. No pubic symphyseal or sacroiliac diastasis. IMPRESSION: Displaced left femoral neck fracture. Electronically Signed   By: Narda Rutherford M.D.   On: 10/07/2023 15:55   CT HEAD WO CONTRAST ( ) Result Date: 10/07/2023 CLINICAL DATA:  Head trauma, minor (Age >= 65y); Neck trauma (Age >= 65y). Fall yesterday with head strike. EXAM: CT HEAD WITHOUT CONTRAST CT CERVICAL SPINE WITHOUT CONTRAST TECHNIQUE: Multidetector CT imaging of the head and cervical spine was performed following the standard protocol without intravenous contrast. Multiplanar CT image reconstructions of the cervical spine were also generated. RADIATION DOSE REDUCTION: This exam was performed according to the departmental dose-optimization program which includes automated exposure control, adjustment of the mA and/or kV according to patient size and/or use of iterative reconstruction technique. COMPARISON:  CT head and cervical spine 04/14/2023 FINDINGS: CT HEAD  FINDINGS Brain: There is no evidence of an acute infarct, intracranial hemorrhage, mass, midline shift, or extra-axial fluid collection. There is unchanged mild cerebral atrophy. Cerebral white matter hypodensities are similar to the prior CT and are nonspecific but compatible with mild chronic small vessel ischemic disease. Vascular: No hyperdense vessel. Skull: No acute fracture or suspicious lesion. Sinuses/Orbits: Chronic posterior left ethmoid air cell opacification. New small to moderate volume fluid in the left sphenoid and right maxillary sinuses with mild mucosal thickening in the latter as well. Increased, large volume fluid and mucosal thickening resulting  in near complete opacification of the left maxillary sinus. Clear mastoid air cells. Bilateral cataract extraction. Other: None. CT CERVICAL SPINE FINDINGS Alignment: Normal. Skull base and vertebrae: No acute fracture or suspicious lesion. Soft tissues and spinal canal: No prevertebral fluid or swelling. No visible canal hematoma. Disc levels: No significant disc degeneration for age. Moderately advanced facet arthrosis in the mid and upper cervical spine, overall worse on the right. Upper chest: Biapical lung scarring. Other: None. IMPRESSION: 1. No evidence of acute intracranial abnormality or cervical spine fracture. 2. Mild chronic small vessel ischemic disease. 3. Sinusitis, including near complete opacification of the left maxillary sinus by fluid. Correlate for acute sinusitis. Electronically Signed   By: Sebastian Ache M.D.   On: 10/07/2023 15:15   CT Cervical Spine Wo Contrast Result Date: 10/07/2023 CLINICAL DATA:  Head trauma, minor (Age >= 65y); Neck trauma (Age >= 65y). Fall yesterday with head strike. EXAM: CT HEAD WITHOUT CONTRAST CT CERVICAL SPINE WITHOUT CONTRAST TECHNIQUE: Multidetector CT imaging of the head and cervical spine was performed following the standard protocol without intravenous contrast. Multiplanar CT image  reconstructions of the cervical spine were also generated. RADIATION DOSE REDUCTION: This exam was performed according to the departmental dose-optimization program which includes automated exposure control, adjustment of the mA and/or kV according to patient size and/or use of iterative reconstruction technique. COMPARISON:  CT head and cervical spine 04/14/2023 FINDINGS: CT HEAD FINDINGS Brain: There is no evidence of an acute infarct, intracranial hemorrhage, mass, midline shift, or extra-axial fluid collection. There is unchanged mild cerebral atrophy. Cerebral white matter hypodensities are similar to the prior CT and are nonspecific but compatible with mild chronic small vessel ischemic disease. Vascular: No hyperdense vessel. Skull: No acute fracture or suspicious lesion. Sinuses/Orbits: Chronic posterior left ethmoid air cell opacification. New small to moderate volume fluid in the left sphenoid and right maxillary sinuses with mild mucosal thickening in the latter as well. Increased, large volume fluid and mucosal thickening resulting in near complete opacification of the left maxillary sinus. Clear mastoid air cells. Bilateral cataract extraction. Other: None. CT CERVICAL SPINE FINDINGS Alignment: Normal. Skull base and vertebrae: No acute fracture or suspicious lesion. Soft tissues and spinal canal: No prevertebral fluid or swelling. No visible canal hematoma. Disc levels: No significant disc degeneration for age. Moderately advanced facet arthrosis in the mid and upper cervical spine, overall worse on the right. Upper chest: Biapical lung scarring. Other: None. IMPRESSION: 1. No evidence of acute intracranial abnormality or cervical spine fracture. 2. Mild chronic small vessel ischemic disease. 3. Sinusitis, including near complete opacification of the left maxillary sinus by fluid. Correlate for acute sinusitis. Electronically Signed   By: Sebastian Ache M.D.   On: 10/07/2023 15:15    Microbiology: No  results found for this or any previous visit.  Labs: CBC: Recent Labs  Lab 10/09/23 0144 10/10/23 0609 10/11/23 0523 10/12/23 0416 10/14/23 0444  WBC 11.4* 9.9 8.3 7.2 6.7  HGB 9.4* 9.3* 9.3* 9.4* 9.1*  HCT 28.6* 27.5* 27.8* 28.5* 27.4*  MCV 90.8 89.9 89.7 90.2 90.7  PLT 194 186 206 228 255   Basic Metabolic Panel: Recent Labs  Lab 10/07/23 1359 10/08/23 1635 10/09/23 0144 10/10/23 0609 10/11/23 0523 10/14/23 0444  NA 136  --  134* 134* 134* 136  K 3.7  --  3.6 3.9 3.6 4.2  CL 104  --  106 105 103 104  CO2 21*  --  23 20* 22 25  GLUCOSE 314*  --  108* 210* 195* 234*  BUN 21  --  19 19 18  27*  CREATININE 1.20 1.00 1.02 1.16 0.99 0.94  CALCIUM 8.7*  --  7.8* 8.1* 8.2* 8.4*   Liver Function Tests: No results for input(s): "AST", "ALT", "ALKPHOS", "BILITOT", "PROT", "ALBUMIN" in the last 168 hours. CBG: Recent Labs  Lab 10/13/23 0754 10/13/23 1148 10/13/23 1711 10/13/23 2103 10/14/23 0752  GLUCAP 162* 233* 195* 222* 222*    Discharge time spent: greater than 30 minutes.  Signed: Alford Highland, MD [facility] 10/14/2023

## 2023-10-14 NOTE — Inpatient Diabetes Management (Signed)
 Inpatient Diabetes Program Recommendations  AACE/ADA: New Consensus Statement on Inpatient Glycemic Control   Target Ranges:  Prepandial:   less than 140 mg/dL      Peak postprandial:   less than 180 mg/dL (1-2 hours)      Critically ill patients:  140 - 180 mg/dL    Latest Reference Range & Units 10/13/23 07:54 10/13/23 11:48 10/13/23 17:11 10/13/23 21:03 10/14/23 07:52  Glucose-Capillary 70 - 99 mg/dL 161 (H) 096 (H) 045 (H) 222 (H) 222 (H)   Review of Glycemic Control  Diabetes history: DM2 Outpatient Diabetes medications: Metformin 1000 mg QAM Current orders for Inpatient glycemic control: Novolog 0-15 units TID with meals, Novolog 0-5 units QHS  Inpatient Diabetes Program Recommendations:    Insulin: May want to consider ordering Novolog 2 units TID with meals for meal coverage if patient eats at least 50% of meals.  Thanks, Orlando Penner, RN, MSN, CDCES Diabetes Coordinator Inpatient Diabetes Program [phone] (Team Pager from 8am to 5pm)

## 2023-10-14 NOTE — Progress Notes (Signed)
 Patient discharged via [facility] to [facility] with all belongings sent with patient. Report called and given to Alyssa from [facility] all questions answered. Ivs removed.

## 2023-10-14 NOTE — Assessment & Plan Note (Signed)
 Can go back on testosterone gel.

## 2023-10-14 NOTE — Care Management Important Message (Signed)
 Important Message  Patient Details  Name: Troy Preston MRN: [id number] Date of Birth: [birth year]   Important Message Given:  Yes - Medicare IM     Bernadette Hoit 10/14/2023, 10:31 AM

## 2024-11-27 ENCOUNTER — Ambulatory Visit: Admitting: Neurology
